# Patient Record
Sex: Female | Born: 1980 | Race: White | Hispanic: No | Marital: Single | Smoking: Current every day smoker
Health system: Southern US, Community
[De-identification: ages and names within clinical notes are randomized; demographics above are authoritative.]

## PROBLEM LIST (undated history)

## (undated) DIAGNOSIS — R112 Nausea with vomiting, unspecified: Secondary | ICD-10-CM

## (undated) DIAGNOSIS — K8020 Calculus of gallbladder without cholecystitis without obstruction: Secondary | ICD-10-CM

## (undated) DIAGNOSIS — Z9889 Other specified postprocedural states: Secondary | ICD-10-CM

## (undated) DIAGNOSIS — F988 Other specified behavioral and emotional disorders with onset usually occurring in childhood and adolescence: Secondary | ICD-10-CM

## (undated) HISTORY — PX: LEG SURGERY: SHX1003

---
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
## 2011-11-17 ENCOUNTER — Encounter: Payer: Self-pay | Admitting: *Deleted

## 2011-11-17 ENCOUNTER — Emergency Department (HOSPITAL_BASED_OUTPATIENT_CLINIC_OR_DEPARTMENT_OTHER)
Admission: EM | Admit: 2011-11-17 | Discharge: 2011-11-17 | Disposition: A | Discharge status: Home / Self Care | Payer: Self-pay | Attending: Emergency Medicine | Admitting: Emergency Medicine

## 2011-11-17 DIAGNOSIS — K279 Peptic ulcer, site unspecified, unspecified as acute or chronic, without hemorrhage or perforation: Secondary | ICD-10-CM | POA: Insufficient documentation

## 2011-11-17 DIAGNOSIS — R1013 Epigastric pain: Secondary | ICD-10-CM | POA: Insufficient documentation

## 2011-11-17 LAB — CBC
HCT: 38.9 % (ref 36.0–46.0)
Hemoglobin: 13.5 g/dL (ref 12.0–15.0)
MCH: 29.7 pg (ref 26.0–34.0)
MCHC: 34.7 g/dL (ref 30.0–36.0)
MCV: 85.7 fL (ref 78.0–100.0)
Platelets: 369 10*3/uL (ref 150–400)
RBC: 4.54 MIL/uL (ref 3.87–5.11)
RDW: 13.2 % (ref 11.5–15.5)
WBC: 12.1 10*3/uL — ABNORMAL HIGH (ref 4.0–10.5)

## 2011-11-17 LAB — URINALYSIS, ROUTINE W REFLEX MICROSCOPIC
Bilirubin Urine: NEGATIVE
Ketones, ur: NEGATIVE mg/dL
Nitrite: NEGATIVE
Urobilinogen, UA: 0.2 mg/dL (ref 0.0–1.0)

## 2011-11-17 LAB — COMPREHENSIVE METABOLIC PANEL
Alkaline Phosphatase: 76 U/L (ref 39–117)
BUN: 9 mg/dL (ref 6–23)
Chloride: 102 mEq/L (ref 96–112)
GFR calc Af Amer: >90 mL/min (ref >90)
Glucose, Bld: 170 mg/dL — ABNORMAL HIGH (ref 70–99)
Potassium: 4.1 mEq/L (ref 3.5–5.1)
Total Bilirubin: 0.2 mg/dL — ABNORMAL LOW (ref 0.3–1.2)

## 2011-11-17 LAB — DIFFERENTIAL
Eosinophils Absolute: 0.2 10*3/uL (ref 0.0–0.7)
Lymphs Abs: 3.5 10*3/uL (ref 0.7–4.0)
Monocytes Relative: 6 % (ref 3–12)
Neutrophils Relative %: 64 % (ref 43–77)

## 2011-11-17 LAB — LIPASE, BLOOD: Lipase: 40 U/L (ref 11–59)

## 2011-11-17 MED ORDER — GI COCKTAIL ~~LOC~~
30.0000 mL | Freq: Once | ORAL | Status: AC
  Administered 2011-11-17: 30 mL via ORAL
  Filled 2011-11-17: qty 30

## 2011-11-17 MED ORDER — FAMOTIDINE 20 MG PO TABS: 20.0000 mg | ORAL_TABLET | Freq: Two times a day (BID) | ORAL | Status: DC

## 2011-11-17 MED ORDER — FAMOTIDINE 20 MG PO TABS
20.0000 mg | ORAL_TABLET | Freq: Once | ORAL | Status: AC
  Administered 2011-11-17: 20 mg via ORAL
  Filled 2011-11-17: qty 1

## 2011-11-17 NOTE — ED Provider Notes (Signed)
History     CSN: [id number]  Arrival date & time 11/17/11  0241   First MD Initiated Contact with Patient 11/17/11 0256      Chief Complaint  Patient presents with  . Abdominal Pain    (Consider location/radiation/quality/duration/timing/severity/associated sxs/prior treatment) HPI Comments: 30 year old female with no significant past medical history, presents with upper abdominal pain which occurred at 10:30 this evening, has been persistent, worse with laying supine, worse with palpation, associated with nausea. She denies vomiting, fever, chills, shortness of breath, back pain, dysuria, constipation, diarrhea, rectal bleeding. She states that she has had intermittent symptoms similar to this in the past but they were fleeting. Tonight her symptoms have been persistent for upwards of 4 hours. Currently she rates her tendon 7-8/10, dull and achy.  Patient admits to using frequent NSAID therapy including Advil, Naprosyn. She denies prednisone use, aspirin use. She does drink occasional alcohol and has family members with history of pancreatitis  Patient is a 30 y.o. female presenting with abdominal pain. The history is provided by the patient and a relative.  Abdominal Pain The primary symptoms of the illness include abdominal pain.    History reviewed. No pertinent past medical history.  History reviewed. No pertinent past surgical history.  No family history on file.  History  Substance Use Topics  . Smoking status: Not on file  . Smokeless tobacco: Not on file  . Alcohol Use: No    OB History    Grav Para Term Preterm Abortions TAB SAB Ect Mult Living                  Review of Systems  Gastrointestinal: Positive for abdominal pain.  All other systems reviewed and are negative.    Allergies  Review of patient's allergies indicates no known allergies.  Home Medications   Current Outpatient Rx  Name Route Sig Dispense Refill  . LISDEXAMFETAMINE DIMESYLATE 70  MG PO CAPS Oral Take 70 mg by mouth every morning.        BP 160/85  Pulse 83  Temp(Src) 97.9 F (36.6 C) (Oral)  Resp 20  SpO2 98%  Physical Exam  Nursing note and vitals reviewed. Constitutional: She appears well-developed and well-nourished. No distress.  HENT:  Head: Normocephalic and atraumatic.  Mouth/Throat: Oropharynx is clear and moist. No oropharyngeal exudate.  Eyes: Conjunctivae and EOM are normal. Pupils are equal, round, and reactive to light. Right eye exhibits no discharge. Left eye exhibits no discharge. No scleral icterus.  Neck: Normal range of motion. Neck supple. No JVD present. No thyromegaly present.  Cardiovascular: Normal rate, regular rhythm, normal heart sounds and intact distal pulses.  Exam reveals no gallop and no friction rub.   No murmur heard. Pulmonary/Chest: Effort normal and breath sounds normal. No respiratory distress. She has no wheezes. She has no rales.  Abdominal: Soft. Bowel sounds are normal. She exhibits no distension and no mass. There is tenderness ( Epigastric and right upper quadrant tenderness with mild guarding. Non-peritoneal, no pain at McBurney's point, no suprapubic tenderness, non-peritoneal).       No CVA tenderness  Musculoskeletal: Normal range of motion. She exhibits no edema and no tenderness.  Lymphadenopathy:    She has no cervical adenopathy.  Neurological: She is alert. Coordination normal.  Skin: Skin is warm and dry. No rash noted. No erythema.  Psychiatric: She has a normal mood and affect. Her behavior is normal.    ED Course  Procedures (including critical care time)  Labs Reviewed  CBC - Abnormal; Notable for the following:    WBC 12.1 (*)    All other components within normal limits  COMPREHENSIVE METABOLIC PANEL - Abnormal; Notable for the following:    Glucose, Bld 170 (*)    Total Bilirubin 0.2 (*)    All other components within normal limits  DIFFERENTIAL  LIPASE, BLOOD  URINALYSIS, ROUTINE W  REFLEX MICROSCOPIC  PREGNANCY, URINE   No results found.   1. PUD (peptic ulcer disease)       MDM  Patient has tenderness in her upper abdomen consistent with several different possible disease process is, rule out pancreatitis, peptic ulcer disease, cholecystitis. Lab work, IV medications, GI cocktail.  Laboratory workup reveals  That patient has normal liver function tests, normal lipase, normal urinalysis, not pregnant, slight leukocytosis at 12.1. After taking the GI cocktail she improved significantly. This is consistent with a diagnosis of peptic ulcer disease or gastritis. I have given her a GI cocktail as well as Pepcid prior to discharge. I discussed the findings with her and indications for return. She will followup with her family Dr. for ongoing treatment of gastritis, peptic ulcer disease. I have discussed with her at length the foods and medications which she should not take.      Vida Roller, MD 11/17/11 [phone]

## 2011-11-17 NOTE — ED Notes (Signed)
Generalized abd pain. Feels nauseous but denies vomiting.

## 2011-12-13 ENCOUNTER — Encounter (HOSPITAL_BASED_OUTPATIENT_CLINIC_OR_DEPARTMENT_OTHER): Payer: Self-pay | Admitting: *Deleted

## 2011-12-13 ENCOUNTER — Emergency Department (INDEPENDENT_AMBULATORY_CARE_PROVIDER_SITE_OTHER): Payer: Self-pay

## 2011-12-13 ENCOUNTER — Emergency Department (HOSPITAL_BASED_OUTPATIENT_CLINIC_OR_DEPARTMENT_OTHER)
Admission: EM | Admit: 2011-12-13 | Discharge: 2011-12-13 | Disposition: A | Discharge status: Home / Self Care | Payer: Self-pay | Attending: Emergency Medicine | Admitting: Emergency Medicine

## 2011-12-13 DIAGNOSIS — S63602A Unspecified sprain of left thumb, initial encounter: Secondary | ICD-10-CM

## 2011-12-13 DIAGNOSIS — M79609 Pain in unspecified limb: Secondary | ICD-10-CM

## 2011-12-13 DIAGNOSIS — S6390XA Sprain of unspecified part of unspecified wrist and hand, initial encounter: Secondary | ICD-10-CM | POA: Insufficient documentation

## 2011-12-13 DIAGNOSIS — W19XXXA Unspecified fall, initial encounter: Secondary | ICD-10-CM

## 2011-12-13 MED ORDER — HYDROCODONE-ACETAMINOPHEN 5-325 MG PO TABS: 1.0000 | ORAL_TABLET | Freq: Four times a day (QID) | ORAL | Status: AC | PRN

## 2011-12-13 NOTE — ED Provider Notes (Signed)
History     CSN: [id number]  Arrival date & time 12/13/11  1322   First MD Initiated Contact with Patient 12/13/11 1352    HPI Pt reports tripping and falling yesterday. States she hyperflexed her left thumb and wrist. States she felt something pop. Reports since has had swelling, bruising and pain in thumb.  Patient is a 31 y.o. female presenting with hand pain. The history is provided by the patient.  Hand Pain This is a new problem. The current episode started yesterday. The problem occurs constantly. The problem has been gradually worsening. Pertinent negatives include no neck pain, numbness or weakness. The symptoms are aggravated by bending (palpation of injured finger). She has tried rest for the symptoms. The treatment provided no relief.    History reviewed. No pertinent past medical history.  Past Surgical History  Procedure Date  . Leg surgery     No family history on file.  History  Substance Use Topics  . Smoking status: Not on file  . Smokeless tobacco: Not on file  . Alcohol Use: No    OB History    Grav Para Term Preterm Abortions TAB SAB Ect Mult Living                  Review of Systems  HENT: Negative for neck pain.   Musculoskeletal:       Thumb injury, pain and swelling  Skin: Negative for wound.  Neurological: Negative for weakness and numbness.  All other systems reviewed and are negative.    Allergies  Review of patient's allergies indicates no known allergies.  Home Medications   Current Outpatient Rx  Name Route Sig Dispense Refill  . FAMOTIDINE 20 MG PO TABS Oral Take 1 tablet (20 mg total) by mouth 2 (two) times daily. 30 tablet 0  . LISDEXAMFETAMINE DIMESYLATE 70 MG PO CAPS Oral Take 70 mg by mouth every morning.        BP 147/104  Pulse 89  Temp(Src) 98.1 F (36.7 C) (Oral)  Resp 20  SpO2 97%  LMP 11/22/2011  Physical Exam  Vitals reviewed. Constitutional: She is oriented to person, place, and time. Vital signs are  normal. She appears well-developed and well-nourished. No distress.  HENT:  Head: Normocephalic and atraumatic.  Eyes: Pupils are equal, round, and reactive to light.  Neck: Neck supple.  Pulmonary/Chest: Effort normal.  Musculoskeletal:       Left thumb mildly contused. Tender to palpation but full tendon function and strength. NML cap refill. Full ROM. Moderate edema  Neurological: She is alert and oriented to person, place, and time.  Skin: Skin is warm and dry. No rash noted. No erythema. No pallor.  Psychiatric: She has a normal mood and affect. Her behavior is normal.    ED Course  Procedures  Dg Finger Thumb Left  12/13/2011  *RADIOLOGY REPORT*  Clinical Data: Fall.  Pain.  LEFT THUMB 2+V  Comparison: None.  Findings: Apparent osseous irregularity at the metacarpal phalangeal joint is favored to be secondary to an accessory ossicle on the first image.  Otherwise, no fracture or dislocation.  No definite soft tissue swelling.  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 Otherwise, no acute osseous finding.  Original Report Authenticated By: Consuello Bossier, M.D.     Diagnosis: Thumb sprain   MDM   Placed patient in Thumb spica. Advised f/u with hand ortho if no improvement.  Thomasene Lot, PA-C 12/13/11 1508  Thomasene Lot, PA-C 12/13/11 1517

## 2011-12-13 NOTE — ED Notes (Signed)
Tripped and fell inj to her left thumb last pm. Bruising and pain at this time. Has been using ice.

## 2011-12-13 NOTE — ED Provider Notes (Signed)
Medical screening examination/treatment/procedure(s) were performed by non-physician practitioner and as supervising physician I was immediately available for consultation/collaboration.   Samora Jernberg R Epifania Littrell, MD 12/13/11 1618 

## 2013-11-09 ENCOUNTER — Emergency Department (HOSPITAL_BASED_OUTPATIENT_CLINIC_OR_DEPARTMENT_OTHER)
Admission: EM | Admit: 2013-11-09 | Discharge: 2013-11-09 | Disposition: A | Discharge status: Home / Self Care | Payer: Self-pay | Attending: Emergency Medicine | Admitting: Emergency Medicine

## 2013-11-09 ENCOUNTER — Encounter (HOSPITAL_BASED_OUTPATIENT_CLINIC_OR_DEPARTMENT_OTHER): Payer: Self-pay | Admitting: Emergency Medicine

## 2013-11-09 DIAGNOSIS — F17200 Nicotine dependence, unspecified, uncomplicated: Secondary | ICD-10-CM | POA: Insufficient documentation

## 2013-11-09 DIAGNOSIS — Z9889 Other specified postprocedural states: Secondary | ICD-10-CM | POA: Insufficient documentation

## 2013-11-09 DIAGNOSIS — L03116 Cellulitis of left lower limb: Secondary | ICD-10-CM

## 2013-11-09 DIAGNOSIS — F988 Other specified behavioral and emotional disorders with onset usually occurring in childhood and adolescence: Secondary | ICD-10-CM | POA: Insufficient documentation

## 2013-11-09 DIAGNOSIS — L02419 Cutaneous abscess of limb, unspecified: Secondary | ICD-10-CM | POA: Insufficient documentation

## 2013-11-09 DIAGNOSIS — Z79899 Other long term (current) drug therapy: Secondary | ICD-10-CM | POA: Insufficient documentation

## 2013-11-09 DIAGNOSIS — Z87828 Personal history of other (healed) physical injury and trauma: Secondary | ICD-10-CM | POA: Insufficient documentation

## 2013-11-09 HISTORY — DX: Other specified behavioral and emotional disorders with onset usually occurring in childhood and adolescence: F98.8

## 2013-11-09 LAB — CBC WITH DIFFERENTIAL/PLATELET
Basophils Absolute: 0 10*3/uL (ref 0.0–0.1)
Eosinophils Absolute: 0.1 10*3/uL (ref 0.0–0.7)
Eosinophils Relative: 1 % (ref 0–5)
Lymphs Abs: 2.4 10*3/uL (ref 0.7–4.0)
MCH: 28.1 pg (ref 26.0–34.0)
MCV: 87.4 fL (ref 78.0–100.0)
Platelets: 411 10*3/uL — ABNORMAL HIGH (ref 150–400)
RDW: 13.9 % (ref 11.5–15.5)

## 2013-11-09 LAB — BASIC METABOLIC PANEL
Calcium: 9.7 mg/dL (ref 8.4–10.5)
Creatinine, Ser: 0.6 mg/dL (ref 0.50–1.10)
GFR calc non Af Amer: >90 mL/min (ref >90)
Glucose, Bld: 118 mg/dL — ABNORMAL HIGH (ref 70–99)
Sodium: 137 mEq/L (ref 135–145)

## 2013-11-09 MED ORDER — ALBUTEROL SULFATE HFA 108 (90 BASE) MCG/ACT IN AERS
2.0000 | INHALATION_SPRAY | RESPIRATORY_TRACT | Status: DC | PRN
  Administered 2013-11-09: 2 via RESPIRATORY_TRACT
  Filled 2013-11-09: qty 6.7

## 2013-11-09 MED ORDER — CEPHALEXIN 250 MG PO CAPS
500.0000 mg | ORAL_CAPSULE | Freq: Once | ORAL | Status: AC
  Administered 2013-11-09: 500 mg via ORAL
  Filled 2013-11-09: qty 2

## 2013-11-09 MED ORDER — CEPHALEXIN 500 MG PO CAPS: 500.0000 mg | ORAL_CAPSULE | Freq: Four times a day (QID) | ORAL | Status: DC

## 2013-11-09 NOTE — ED Notes (Signed)
At d/c pt c/o of feeling sob at times. No wheezing noted. resp even and unlabored. 02 sats 100% on RA. No distress. Dr. Bernette Mayers aware and asked resp to give pt an inhaler. Sarah, RT in to evaluate pt.

## 2013-11-09 NOTE — ED Notes (Signed)
Pt c/o left leg swelling with redness x 3 days
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
## 2013-11-21 ENCOUNTER — Emergency Department (HOSPITAL_BASED_OUTPATIENT_CLINIC_OR_DEPARTMENT_OTHER)
Admission: EM | Admit: 2013-11-21 | Discharge: 2013-11-21 | Discharge status: Against Medical Advice | Payer: Self-pay | Attending: Emergency Medicine | Admitting: Emergency Medicine

## 2013-11-21 ENCOUNTER — Encounter (HOSPITAL_BASED_OUTPATIENT_CLINIC_OR_DEPARTMENT_OTHER): Payer: Self-pay | Admitting: Emergency Medicine

## 2013-11-21 DIAGNOSIS — F17200 Nicotine dependence, unspecified, uncomplicated: Secondary | ICD-10-CM | POA: Insufficient documentation

## 2013-11-21 DIAGNOSIS — M7989 Other specified soft tissue disorders: Secondary | ICD-10-CM | POA: Insufficient documentation

## 2013-11-21 NOTE — ED Notes (Signed)
Pt c/o bil leg swelling seen here on 12/14 for same dx cellultiis completed ABX

## 2013-11-22 ENCOUNTER — Ambulatory Visit (HOSPITAL_BASED_OUTPATIENT_CLINIC_OR_DEPARTMENT_OTHER): Payer: Self-pay

## 2013-11-22 ENCOUNTER — Emergency Department (HOSPITAL_BASED_OUTPATIENT_CLINIC_OR_DEPARTMENT_OTHER)
Admission: EM | Admit: 2013-11-22 | Discharge: 2013-11-22 | Disposition: A | Discharge status: Home / Self Care | Payer: Self-pay | Attending: Emergency Medicine | Admitting: Emergency Medicine

## 2013-11-22 ENCOUNTER — Encounter (HOSPITAL_BASED_OUTPATIENT_CLINIC_OR_DEPARTMENT_OTHER): Payer: Self-pay | Admitting: Emergency Medicine

## 2013-11-22 ENCOUNTER — Emergency Department (HOSPITAL_BASED_OUTPATIENT_CLINIC_OR_DEPARTMENT_OTHER): Payer: Self-pay

## 2013-11-22 DIAGNOSIS — Z79899 Other long term (current) drug therapy: Secondary | ICD-10-CM | POA: Insufficient documentation

## 2013-11-22 DIAGNOSIS — L02419 Cutaneous abscess of limb, unspecified: Secondary | ICD-10-CM | POA: Insufficient documentation

## 2013-11-22 DIAGNOSIS — E669 Obesity, unspecified: Secondary | ICD-10-CM | POA: Insufficient documentation

## 2013-11-22 DIAGNOSIS — Z3202 Encounter for pregnancy test, result negative: Secondary | ICD-10-CM | POA: Insufficient documentation

## 2013-11-22 DIAGNOSIS — L03116 Cellulitis of left lower limb: Secondary | ICD-10-CM

## 2013-11-22 DIAGNOSIS — F17200 Nicotine dependence, unspecified, uncomplicated: Secondary | ICD-10-CM | POA: Insufficient documentation

## 2013-11-22 DIAGNOSIS — R630 Anorexia: Secondary | ICD-10-CM | POA: Insufficient documentation

## 2013-11-22 DIAGNOSIS — R10811 Right upper quadrant abdominal tenderness: Secondary | ICD-10-CM

## 2013-11-22 DIAGNOSIS — F988 Other specified behavioral and emotional disorders with onset usually occurring in childhood and adolescence: Secondary | ICD-10-CM | POA: Insufficient documentation

## 2013-11-22 DIAGNOSIS — R1011 Right upper quadrant pain: Secondary | ICD-10-CM | POA: Insufficient documentation

## 2013-11-22 LAB — CBC WITH DIFFERENTIAL/PLATELET
Basophils Relative: 0 % (ref 0–1)
Eosinophils Absolute: 0.1 10*3/uL (ref 0.0–0.7)
Eosinophils Relative: 1 % (ref 0–5)
HCT: 40.3 % (ref 36.0–46.0)
Hemoglobin: 13 g/dL (ref 12.0–15.0)
Lymphs Abs: 3.4 10*3/uL (ref 0.7–4.0)
MCH: 27.9 pg (ref 26.0–34.0)
MCHC: 32.3 g/dL (ref 30.0–36.0)
MCV: 86.5 fL (ref 78.0–100.0)
Monocytes Absolute: 0.7 10*3/uL (ref 0.1–1.0)
Monocytes Relative: 5 % (ref 3–12)
Neutrophils Relative %: 69 % (ref 43–77)

## 2013-11-22 LAB — COMPREHENSIVE METABOLIC PANEL
Albumin: 4.1 g/dL (ref 3.5–5.2)
BUN: 10 mg/dL (ref 6–23)
Creatinine, Ser: 0.7 mg/dL (ref 0.50–1.10)
GFR calc Af Amer: >90 mL/min (ref >90)
Glucose, Bld: 116 mg/dL — ABNORMAL HIGH (ref 70–99)
Total Protein: 8 g/dL (ref 6.0–8.3)

## 2013-11-22 LAB — URINALYSIS, ROUTINE W REFLEX MICROSCOPIC
Bilirubin Urine: NEGATIVE
Glucose, UA: NEGATIVE mg/dL
Hgb urine dipstick: NEGATIVE
Ketones, ur: NEGATIVE mg/dL
Leukocytes, UA: NEGATIVE
Nitrite: NEGATIVE
Protein, ur: NEGATIVE mg/dL
Specific Gravity, Urine: 1.021 (ref 1.005–1.030)
Urobilinogen, UA: 0.2 mg/dL (ref 0.0–1.0)
pH: 6.5 (ref 5.0–8.0)

## 2013-11-22 LAB — LIPASE, BLOOD: Lipase: 46 U/L (ref 11–59)

## 2013-11-22 LAB — PREGNANCY, URINE: Preg Test, Ur: NEGATIVE

## 2013-11-22 MED ORDER — FAMOTIDINE 20 MG PO TABS: 20.0000 mg | ORAL_TABLET | Freq: Two times a day (BID) | ORAL | Status: DC

## 2013-11-22 MED ORDER — CEPHALEXIN 500 MG PO CAPS: 500.0000 mg | ORAL_CAPSULE | Freq: Three times a day (TID) | ORAL | Status: DC

## 2013-11-22 NOTE — ED Notes (Signed)
Patient here with RUQ pain that started after eating this am, reports as persistent , no nausea, no diarrhea

## 2013-11-22 NOTE — ED Notes (Signed)
Radiology tech is assisting with scheduling return visit for an outpatient ultrasound.

## 2013-11-22 NOTE — ED Provider Notes (Signed)
CSN: [id number]     Arrival date & time 11/22/13  1329 History   First MD Initiated Contact with Patient 11/22/13 1542     Chief Complaint  Patient presents with  . Abdominal Pain   (Consider location/radiation/quality/duration/timing/severity/associated sxs/prior Treatment) Patient is a 32 y.o. female presenting with abdominal pain. The history is provided by the patient.  Abdominal Pain Pain radiates to:  RUQ Pain severity:  Moderate Onset quality:  Gradual Duration:  8 hours Timing:  Constant Progression:  Improving Chronicity:  New Context: eating   Context: not previous surgeries, not recent travel, not sick contacts and not suspicious food intake  Recent illness: cellulitis of left lower leg.   Relieved by:  Nothing Worsened by:  Eating Ineffective treatments:  Antacids and OTC medications Associated symptoms: anorexia   Associated symptoms: no chest pain, no chills, no constipation, no cough, no dysuria, no fever, no hematemesis, no hematuria, no melena, no nausea, no shortness of breath, no sore throat, no vaginal bleeding and no vomiting   Risk factors: obesity   Risk factors: no alcohol abuse, has not had multiple surgeries, no NSAID use and not pregnant    Tasha Flores is a 32 y.o. female who presents to the ED with RUQ abdominal pain that started approximately 8 hours prior to arrival to the ED. The pain started after eating. She also states that she was evaluated previously for cellulitis of the left lower leg and it got better with the antibiotics but now has started to have redness again.   Past Medical History  Diagnosis Date  . ADD (attention deficit disorder)    Past Surgical History  Procedure Laterality Date  . Leg surgery     No family history on file. History  Substance Use Topics  . Smoking status: Current Every Day Smoker -- 0.50 packs/day    Types: Cigarettes  . Smokeless tobacco: Not on file  . Alcohol Use: No   OB History   Grav Para  Term Preterm Abortions TAB SAB Ect Mult Living                 Review of Systems  Constitutional: Negative for fever and chills.  HENT: Negative.  Negative for sore throat.   Eyes: Negative for visual disturbance.  Respiratory: Negative for cough and shortness of breath.   Cardiovascular: Negative for chest pain.  Gastrointestinal: Positive for abdominal pain and anorexia. Negative for nausea, vomiting, constipation, melena and hematemesis.  Genitourinary: Negative for dysuria, urgency, hematuria, decreased urine volume and vaginal bleeding.  Musculoskeletal: Negative for back pain.       Left lower leg with swelling and redness. There is tenderness but better than on last visit.   Skin: Negative for rash.  Neurological: Negative for dizziness and headaches.  Psychiatric/Behavioral: Negative for confusion. The patient is not nervous/anxious.     Allergies  Review of patient's allergies indicates no known allergies.  Home Medications   Current Outpatient Rx  Name  Route  Sig  Dispense  Refill  . escitalopram (LEXAPRO) 20 MG tablet   Oral   Take 20 mg by mouth daily.         Marland Kitchen EXPIRED: famotidine (PEPCID) 20 MG tablet   Oral   Take 1 tablet (20 mg total) by mouth 2 (two) times daily.   30 tablet   0   . lisdexamfetamine (VYVANSE) 70 MG capsule   Oral   Take 70 mg by mouth every morning.  BP 135/87  Pulse 88  Temp(Src) 98 F (36.7 C) (Oral)  Resp 18  SpO2 98%  LMP 10/25/2013 Physical Exam  Nursing note and vitals reviewed. Constitutional: She is oriented to person, place, and time. She appears well-developed and well-nourished. No distress.  HENT:  Head: Normocephalic and atraumatic.  Eyes: Conjunctivae and EOM are normal. Pupils are equal, round, and reactive to light.  Neck: Normal range of motion. Neck supple.  Cardiovascular: Normal rate, regular rhythm and normal heart sounds.   Pulmonary/Chest: Effort normal and breath sounds normal.    Abdominal: Soft. Bowel sounds are normal. There is tenderness in the right upper quadrant.  Musculoskeletal:       Left lower leg: She exhibits tenderness, swelling and deformity.       Legs: Erythema. Pedal pulse present with adequate circulation and good touch sensation. There is deformity noted to the area secondary to a previous graft..  Neurological: She is alert and oriented to person, place, and time. No cranial nerve deficit.  Skin: Skin is warm and dry.  Psychiatric: She has a normal mood and affect. Her behavior is normal.   Results for orders placed during the hospital encounter of 11/22/13 (from the past 24 hour(s))  URINALYSIS, ROUTINE W REFLEX MICROSCOPIC     Status: None   Collection Time    11/22/13  1:45 PM      Result Value Range   Color, Urine YELLOW  YELLOW   APPearance CLEAR  CLEAR   Specific Gravity, Urine 1.021  1.005 - 1.030   pH 6.5  5.0 - 8.0   Glucose, UA NEGATIVE  NEGATIVE mg/dL   Hgb urine dipstick NEGATIVE  NEGATIVE   Bilirubin Urine NEGATIVE  NEGATIVE   Ketones, ur NEGATIVE  NEGATIVE mg/dL   Protein, ur NEGATIVE  NEGATIVE mg/dL   Urobilinogen, UA 0.2  0.0 - 1.0 mg/dL   Nitrite NEGATIVE  NEGATIVE   Leukocytes, UA NEGATIVE  NEGATIVE  PREGNANCY, URINE     Status: None   Collection Time    11/22/13  1:45 PM      Result Value Range   Preg Test, Ur NEGATIVE  NEGATIVE  CBC WITH DIFFERENTIAL     Status: Abnormal   Collection Time    11/22/13  4:20 PM      Result Value Range   WBC 13.9 (*) 4.0 - 10.5 K/uL   RBC 4.66  3.87 - 5.11 MIL/uL   Hemoglobin 13.0  12.0 - 15.0 g/dL   HCT 16.1  09.6 - 04.5 %   MCV 86.5  78.0 - 100.0 fL   MCH 27.9  26.0 - 34.0 pg   MCHC 32.3  30.0 - 36.0 g/dL   RDW 40.9  81.1 - 91.4 %   Platelets 425 (*) 150 - 400 K/uL   Neutrophils Relative % 69  43 - 77 %   Neutro Abs 9.6 (*) 1.7 - 7.7 K/uL   Lymphocytes Relative 25  12 - 46 %   Lymphs Abs 3.4  0.7 - 4.0 K/uL   Monocytes Relative 5  3 - 12 %   Monocytes Absolute 0.7  0.1  - 1.0 K/uL   Eosinophils Relative 1  0 - 5 %   Eosinophils Absolute 0.1  0.0 - 0.7 K/uL   Basophils Relative 0  0 - 1 %   Basophils Absolute 0.0  0.0 - 0.1 K/uL  COMPREHENSIVE METABOLIC PANEL     Status: Abnormal   Collection Time  11/22/13  4:20 PM      Result Value Range   Sodium 137  135 - 145 mEq/L   Potassium 4.0  3.5 - 5.1 mEq/L   Chloride 100  96 - 112 mEq/L   CO2 24  19 - 32 mEq/L   Glucose, Bld 116 (*) 70 - 99 mg/dL   BUN 10  6 - 23 mg/dL   Creatinine, Ser 1.61  0.50 - 1.10 mg/dL   Calcium 09.6  8.4 - 04.5 mg/dL   Total Protein 8.0  6.0 - 8.3 g/dL   Albumin 4.1  3.5 - 5.2 g/dL   AST 12  0 - 37 U/L   ALT 22  0 - 35 U/L   Alkaline Phosphatase 82  39 - 117 U/L   Total Bilirubin 0.4  0.3 - 1.2 mg/dL   GFR calc non Af Amer >90  >90 mL/min   GFR calc Af Amer >90  >90 mL/min  LIPASE, BLOOD     Status: None   Collection Time    11/22/13  4:20 PM      Result Value Range   Lipase 46  11 - 59 U/L    ED Course: I discussed this case with Dr. Oletta Lamas. Will schedule patient to return for ultrasound on Monday. Will give diet instructions. Will treat cellulitis of left lower leg with antibiotics.   Procedures MDM   32 y.o. female with RUQ abdominal pain x 8 hours. Pain increased with eating. Discussed with patient fat free diet and need for return for ultrasound to rule out gall stones. Patient agrees to follow up. Stable for discharge without any immediate complications at this time. She will return immediately for worsening symptoms. Will also give Rx for her cellulitis of the left lower leg.    Medication List    TAKE these medications       cephALEXin 500 MG capsule  Commonly known as:  KEFLEX  Take 1 capsule (500 mg total) by mouth 3 (three) times daily.     famotidine 20 MG tablet  Commonly known as:  PEPCID  Take 1 tablet (20 mg total) by mouth 2 (two) times daily.      ASK your doctor about these medications       escitalopram 20 MG tablet  Commonly known as:   LEXAPRO  Take 20 mg by mouth daily.     lisdexamfetamine 70 MG capsule  Commonly known as:  VYVANSE  Take 70 mg by mouth every morning.          [facility] Orlene Och, NP 11/27/13 1001

## 2013-11-24 ENCOUNTER — Inpatient Hospital Stay (HOSPITAL_BASED_OUTPATIENT_CLINIC_OR_DEPARTMENT_OTHER): Admit: 2013-11-24 | Payer: Self-pay

## 2013-11-24 ENCOUNTER — Other Ambulatory Visit (HOSPITAL_BASED_OUTPATIENT_CLINIC_OR_DEPARTMENT_OTHER): Payer: Self-pay

## 2013-11-24 ENCOUNTER — Ambulatory Visit (HOSPITAL_BASED_OUTPATIENT_CLINIC_OR_DEPARTMENT_OTHER)
Admission: RE | Admit: 2013-11-24 | Discharge: 2013-11-24 | Disposition: A | Discharge status: Home / Self Care | Payer: Self-pay | Source: Ambulatory Visit | Attending: Nurse Practitioner | Admitting: Nurse Practitioner

## 2013-11-24 ENCOUNTER — Other Ambulatory Visit (HOSPITAL_BASED_OUTPATIENT_CLINIC_OR_DEPARTMENT_OTHER): Payer: Self-pay | Admitting: Nurse Practitioner

## 2013-11-24 DIAGNOSIS — K7689 Other specified diseases of liver: Secondary | ICD-10-CM | POA: Insufficient documentation

## 2013-11-24 DIAGNOSIS — R109 Unspecified abdominal pain: Secondary | ICD-10-CM

## 2013-11-24 DIAGNOSIS — K8020 Calculus of gallbladder without cholecystitis without obstruction: Secondary | ICD-10-CM | POA: Insufficient documentation

## 2013-11-24 DIAGNOSIS — R1011 Right upper quadrant pain: Secondary | ICD-10-CM | POA: Insufficient documentation

## 2013-11-24 NOTE — ED Provider Notes (Signed)
Patient had outpatient ultrasound performed and the report was given to me. Report does show multiple gallstones in the gallbladder but no evidence of acute cholecystitis. I did contact the patient at home. She is doing well with improved symptoms. She was given followup information for [facility] surgery. She was also counseled to return to the ER for fever, increasing pain. It was explained to her that this can generally be taken care of as an outpatient, but if she has persistent or worsening pain, surgery to become more urgent and she'll come back to the ER if this occurs.  Gilda Crease, MD 11/24/13 [phone]

## 2013-12-01 NOTE — ED Provider Notes (Signed)
Medical screening examination/treatment/procedure(s) were performed by non-physician practitioner and as supervising physician I was immediately available for consultation/collaboration.  EKG Interpretation   None         Gavin PoundMichael Y. Oletta LamasGhim, MD 12/01/13 [phone]

## 2013-12-03 ENCOUNTER — Observation Stay (HOSPITAL_COMMUNITY): Payer: Self-pay | Admitting: Anesthesiology

## 2013-12-03 ENCOUNTER — Emergency Department (HOSPITAL_BASED_OUTPATIENT_CLINIC_OR_DEPARTMENT_OTHER): Payer: Self-pay

## 2013-12-03 ENCOUNTER — Observation Stay (HOSPITAL_BASED_OUTPATIENT_CLINIC_OR_DEPARTMENT_OTHER)
Admission: EM | Admit: 2013-12-03 | Discharge: 2013-12-04 | Disposition: A | Discharge status: Home / Self Care | Payer: Self-pay | Attending: General Surgery | Admitting: General Surgery

## 2013-12-03 ENCOUNTER — Observation Stay (HOSPITAL_COMMUNITY): Payer: Self-pay

## 2013-12-03 ENCOUNTER — Encounter (HOSPITAL_COMMUNITY)
Admission: EM | Disposition: A | Discharge status: Home / Self Care | Payer: Self-pay | Source: Home / Self Care | Attending: Emergency Medicine

## 2013-12-03 ENCOUNTER — Inpatient Hospital Stay: Admit: 2013-12-03 | Payer: Self-pay | Admitting: General Surgery

## 2013-12-03 ENCOUNTER — Encounter (HOSPITAL_COMMUNITY): Payer: Self-pay | Admitting: Anesthesiology

## 2013-12-03 ENCOUNTER — Encounter (HOSPITAL_BASED_OUTPATIENT_CLINIC_OR_DEPARTMENT_OTHER): Payer: Self-pay | Admitting: Emergency Medicine

## 2013-12-03 DIAGNOSIS — Z79899 Other long term (current) drug therapy: Secondary | ICD-10-CM | POA: Insufficient documentation

## 2013-12-03 DIAGNOSIS — F988 Other specified behavioral and emotional disorders with onset usually occurring in childhood and adolescence: Secondary | ICD-10-CM | POA: Insufficient documentation

## 2013-12-03 DIAGNOSIS — K8010 Calculus of gallbladder with chronic cholecystitis without obstruction: Principal | ICD-10-CM | POA: Insufficient documentation

## 2013-12-03 DIAGNOSIS — R059 Cough, unspecified: Secondary | ICD-10-CM | POA: Insufficient documentation

## 2013-12-03 DIAGNOSIS — K8000 Calculus of gallbladder with acute cholecystitis without obstruction: Secondary | ICD-10-CM | POA: Diagnosis present

## 2013-12-03 DIAGNOSIS — K824 Cholesterolosis of gallbladder: Secondary | ICD-10-CM

## 2013-12-03 DIAGNOSIS — R05 Cough: Secondary | ICD-10-CM | POA: Insufficient documentation

## 2013-12-03 DIAGNOSIS — R062 Wheezing: Secondary | ICD-10-CM | POA: Insufficient documentation

## 2013-12-03 DIAGNOSIS — K8020 Calculus of gallbladder without cholecystitis without obstruction: Secondary | ICD-10-CM

## 2013-12-03 DIAGNOSIS — Z6841 Body Mass Index (BMI) 40.0 and over, adult: Secondary | ICD-10-CM | POA: Insufficient documentation

## 2013-12-03 HISTORY — PX: CHOLECYSTECTOMY: SHX55

## 2013-12-03 HISTORY — DX: Other specified postprocedural states: Z98.890

## 2013-12-03 HISTORY — DX: Calculus of gallbladder without cholecystitis without obstruction: K80.20

## 2013-12-03 HISTORY — DX: Nausea with vomiting, unspecified: R11.2

## 2013-12-03 LAB — URINALYSIS, ROUTINE W REFLEX MICROSCOPIC
GLUCOSE, UA: NEGATIVE mg/dL
HGB URINE DIPSTICK: NEGATIVE
Ketones, ur: NEGATIVE mg/dL
Leukocytes, UA: NEGATIVE
Nitrite: NEGATIVE
PROTEIN: 30 mg/dL — AB
Specific Gravity, Urine: 1.031 — ABNORMAL HIGH (ref 1.005–1.030)
Urobilinogen, UA: 1 mg/dL (ref 0.0–1.0)
pH: 6 (ref 5.0–8.0)

## 2013-12-03 LAB — CBC
HCT: 41.2 % (ref 36.0–46.0)
Hemoglobin: 13.7 g/dL (ref 12.0–15.0)
MCH: 28.1 pg (ref 26.0–34.0)
MCHC: 33.3 g/dL (ref 30.0–36.0)
MCV: 84.4 fL (ref 78.0–100.0)
PLATELETS: 469 10*3/uL — AB (ref 150–400)
RBC: 4.88 MIL/uL (ref 3.87–5.11)
RDW: 14.4 % (ref 11.5–15.5)
WBC: 15.9 10*3/uL — AB (ref 4.0–10.5)

## 2013-12-03 LAB — URINE MICROSCOPIC-ADD ON: RBC / HPF: NONE SEEN RBC/hpf (ref <3)

## 2013-12-03 LAB — COMPREHENSIVE METABOLIC PANEL
ALBUMIN: 4.2 g/dL (ref 3.5–5.2)
ALT: 20 U/L (ref 0–35)
AST: 12 U/L (ref 0–37)
Alkaline Phosphatase: 85 U/L (ref 39–117)
BILIRUBIN TOTAL: 0.4 mg/dL (ref 0.3–1.2)
BUN: 8 mg/dL (ref 6–23)
CHLORIDE: 103 meq/L (ref 96–112)
CO2: 21 mEq/L (ref 19–32)
CREATININE: 0.7 mg/dL (ref 0.50–1.10)
Calcium: 9.3 mg/dL (ref 8.4–10.5)
GFR calc Af Amer: >90 mL/min (ref >90)
GFR calc non Af Amer: >90 mL/min (ref >90)
Glucose, Bld: 144 mg/dL — ABNORMAL HIGH (ref 70–99)
Potassium: 3.6 mEq/L — ABNORMAL LOW (ref 3.7–5.3)
SODIUM: 141 meq/L (ref 137–147)
Total Protein: 7.9 g/dL (ref 6.0–8.3)

## 2013-12-03 LAB — LIPASE, BLOOD: Lipase: 41 U/L (ref 11–59)

## 2013-12-03 LAB — PREGNANCY, URINE: PREG TEST UR: NEGATIVE

## 2013-12-03 SURGERY — LAPAROSCOPIC CHOLECYSTECTOMY WITH INTRAOPERATIVE CHOLANGIOGRAM
Anesthesia: General | Site: Abdomen

## 2013-12-03 MED ORDER — BUPIVACAINE-EPINEPHRINE 0.25% -1:200000 IJ SOLN
INTRAMUSCULAR | Status: DC | PRN
  Administered 2013-12-03: 30 mL

## 2013-12-03 MED ORDER — HYDROMORPHONE HCL PF 1 MG/ML IJ SOLN
1.0000 mg | Freq: Once | INTRAMUSCULAR | Status: AC
Start: 2013-12-03 — End: 2013-12-03
  Administered 2013-12-03: 1 mg via INTRAVENOUS
  Filled 2013-12-03: qty 1

## 2013-12-03 MED ORDER — ESCITALOPRAM OXALATE 20 MG PO TABS
20.0000 mg | ORAL_TABLET | Freq: Every day | ORAL | Status: DC
  Administered 2013-12-03 – 2013-12-04 (×2): 20 mg via ORAL
  Filled 2013-12-03 (×2): qty 1

## 2013-12-03 MED ORDER — MIDAZOLAM HCL 5 MG/5ML IJ SOLN
INTRAMUSCULAR | Status: DC | PRN
  Administered 2013-12-03 (×2): 1 mg via INTRAVENOUS

## 2013-12-03 MED ORDER — NEOSTIGMINE METHYLSULFATE 1 MG/ML IJ SOLN
INTRAMUSCULAR | Status: DC | PRN
  Administered 2013-12-03: 4 mg via INTRAVENOUS

## 2013-12-03 MED ORDER — ONDANSETRON HCL 4 MG/2ML IJ SOLN
4.0000 mg | Freq: Once | INTRAMUSCULAR | Status: AC
  Administered 2013-12-03: 4 mg via INTRAVENOUS
  Filled 2013-12-03: qty 2

## 2013-12-03 MED ORDER — HYDROMORPHONE HCL PF 2 MG/ML IJ SOLN
INTRAMUSCULAR | Status: AC
  Filled 2013-12-03: qty 1

## 2013-12-03 MED ORDER — PIPERACILLIN-TAZOBACTAM 3.375 G IVPB
3.3750 g | Freq: Three times a day (TID) | INTRAVENOUS | Status: DC
  Administered 2013-12-03: 3.375 g via INTRAVENOUS
  Filled 2013-12-03 (×2): qty 50

## 2013-12-03 MED ORDER — IOHEXOL 300 MG/ML  SOLN
INTRAMUSCULAR | Status: DC | PRN
  Administered 2013-12-03: 4 mL via INTRA_ARTERIAL

## 2013-12-03 MED ORDER — LACTATED RINGERS IV SOLN
INTRAVENOUS | Status: DC | PRN
  Administered 2013-12-03 (×2): via INTRAVENOUS

## 2013-12-03 MED ORDER — PROMETHAZINE HCL 25 MG/ML IJ SOLN: 6.2500 mg | INTRAMUSCULAR | Status: DC | PRN

## 2013-12-03 MED ORDER — MIDAZOLAM HCL 2 MG/2ML IJ SOLN
INTRAMUSCULAR | Status: AC
  Filled 2013-12-03: qty 2

## 2013-12-03 MED ORDER — ALBUTEROL SULFATE HFA 108 (90 BASE) MCG/ACT IN AERS
INHALATION_SPRAY | RESPIRATORY_TRACT | Status: AC
  Filled 2013-12-03: qty 6.7

## 2013-12-03 MED ORDER — PROPOFOL 10 MG/ML IV BOLUS
INTRAVENOUS | Status: AC
  Filled 2013-12-03: qty 20

## 2013-12-03 MED ORDER — PNEUMOCOCCAL VAC POLYVALENT 25 MCG/0.5ML IJ INJ
0.5000 mL | INJECTION | INTRAMUSCULAR | Status: AC
  Administered 2013-12-04: 10:00:00 0.5 mL via INTRAMUSCULAR
  Filled 2013-12-03 (×2): qty 0.5

## 2013-12-03 MED ORDER — 0.9 % SODIUM CHLORIDE (POUR BTL) OPTIME
TOPICAL | Status: DC | PRN
  Administered 2013-12-03: 1000 mL

## 2013-12-03 MED ORDER — HYDROMORPHONE HCL PF 1 MG/ML IJ SOLN
0.2500 mg | INTRAMUSCULAR | Status: DC | PRN
  Administered 2013-12-03 (×2): 0.5 mg via INTRAVENOUS

## 2013-12-03 MED ORDER — HYDROMORPHONE HCL PF 1 MG/ML IJ SOLN
INTRAMUSCULAR | Status: DC | PRN
  Administered 2013-12-03 (×2): 0.5 mg via INTRAVENOUS
  Administered 2013-12-03: 1 mg via INTRAVENOUS

## 2013-12-03 MED ORDER — INFLUENZA VAC SPLIT QUAD 0.5 ML IM SUSP
0.5000 mL | INTRAMUSCULAR | Status: AC
  Administered 2013-12-04: 0.5 mL via INTRAMUSCULAR
  Filled 2013-12-03 (×2): qty 0.5

## 2013-12-03 MED ORDER — SUCCINYLCHOLINE CHLORIDE 20 MG/ML IJ SOLN
INTRAMUSCULAR | Status: DC | PRN
  Administered 2013-12-03: 140 mg via INTRAVENOUS

## 2013-12-03 MED ORDER — LACTATED RINGERS IV SOLN
INTRAVENOUS | Status: DC | PRN
  Administered 2013-12-03: 1000 mL

## 2013-12-03 MED ORDER — ROCURONIUM BROMIDE 100 MG/10ML IV SOLN
INTRAVENOUS | Status: DC | PRN
  Administered 2013-12-03: 10 mg via INTRAVENOUS
  Administered 2013-12-03: 40 mg via INTRAVENOUS

## 2013-12-03 MED ORDER — ALBUTEROL SULFATE HFA 108 (90 BASE) MCG/ACT IN AERS
INHALATION_SPRAY | RESPIRATORY_TRACT | Status: DC | PRN
  Administered 2013-12-03 (×2): 2 via RESPIRATORY_TRACT

## 2013-12-03 MED ORDER — HYDROMORPHONE HCL PF 1 MG/ML IJ SOLN
1.0000 mg | INTRAMUSCULAR | Status: DC | PRN
  Administered 2013-12-03 – 2013-12-04 (×6): 1 mg via INTRAVENOUS
  Filled 2013-12-03 (×6): qty 1

## 2013-12-03 MED ORDER — SODIUM CHLORIDE 0.9 % IV BOLUS (SEPSIS)
1000.0000 mL | Freq: Once | INTRAVENOUS | Status: AC
  Administered 2013-12-03: 1000 mL via INTRAVENOUS

## 2013-12-03 MED ORDER — NAPHAZOLINE-PHENIRAMINE 0.025-0.3 % OP SOLN: 1.0000 [drp] | Freq: Four times a day (QID) | OPHTHALMIC | Status: DC | PRN

## 2013-12-03 MED ORDER — FAMOTIDINE 20 MG PO TABS
20.0000 mg | ORAL_TABLET | Freq: Two times a day (BID) | ORAL | Status: DC
  Administered 2013-12-03 – 2013-12-04 (×2): 20 mg via ORAL
  Filled 2013-12-03 (×3): qty 1

## 2013-12-03 MED ORDER — DEXAMETHASONE SODIUM PHOSPHATE 10 MG/ML IJ SOLN
INTRAMUSCULAR | Status: DC | PRN
  Administered 2013-12-03: 10 mg via INTRAVENOUS

## 2013-12-03 MED ORDER — LORATADINE 10 MG PO TABS
10.0000 mg | ORAL_TABLET | Freq: Every day | ORAL | Status: DC
  Administered 2013-12-04: 10:00:00 10 mg via ORAL
  Filled 2013-12-03: qty 1

## 2013-12-03 MED ORDER — LIDOCAINE HCL (CARDIAC) 20 MG/ML IV SOLN
INTRAVENOUS | Status: AC
  Filled 2013-12-03: qty 5

## 2013-12-03 MED ORDER — PROPOFOL 10 MG/ML IV BOLUS
INTRAVENOUS | Status: DC | PRN
  Administered 2013-12-03: 200 mg via INTRAVENOUS

## 2013-12-03 MED ORDER — KETOROLAC TROMETHAMINE 30 MG/ML IJ SOLN: 15.0000 mg | Freq: Once | INTRAMUSCULAR | Status: DC | PRN

## 2013-12-03 MED ORDER — FENTANYL CITRATE 0.05 MG/ML IJ SOLN
INTRAMUSCULAR | Status: AC
  Filled 2013-12-03: qty 5

## 2013-12-03 MED ORDER — ONDANSETRON HCL 4 MG/2ML IJ SOLN
INTRAMUSCULAR | Status: AC
  Filled 2013-12-03: qty 2

## 2013-12-03 MED ORDER — ONDANSETRON HCL 4 MG/2ML IJ SOLN: 4.0000 mg | Freq: Four times a day (QID) | INTRAMUSCULAR | Status: DC | PRN

## 2013-12-03 MED ORDER — ONDANSETRON HCL 4 MG/2ML IJ SOLN
INTRAMUSCULAR | Status: DC | PRN
  Administered 2013-12-03: 4 mg via INTRAVENOUS

## 2013-12-03 MED ORDER — HYDROMORPHONE HCL PF 1 MG/ML IJ SOLN
1.0000 mg | Freq: Once | INTRAMUSCULAR | Status: AC
  Administered 2013-12-03: 1 mg via INTRAVENOUS
  Filled 2013-12-03: qty 1

## 2013-12-03 MED ORDER — DEXAMETHASONE SODIUM PHOSPHATE 10 MG/ML IJ SOLN
INTRAMUSCULAR | Status: AC
  Filled 2013-12-03: qty 1

## 2013-12-03 MED ORDER — ROCURONIUM BROMIDE 100 MG/10ML IV SOLN
INTRAVENOUS | Status: AC
  Filled 2013-12-03: qty 1

## 2013-12-03 MED ORDER — BUPIVACAINE-EPINEPHRINE PF 0.25-1:200000 % IJ SOLN
INTRAMUSCULAR | Status: AC
  Filled 2013-12-03: qty 30

## 2013-12-03 MED ORDER — LIDOCAINE HCL (CARDIAC) 20 MG/ML IV SOLN
INTRAVENOUS | Status: DC | PRN
  Administered 2013-12-03: 80 mg via INTRAVENOUS

## 2013-12-03 MED ORDER — HYDROMORPHONE HCL PF 1 MG/ML IJ SOLN
INTRAMUSCULAR | Status: AC
  Filled 2013-12-03: qty 1

## 2013-12-03 MED ORDER — FENTANYL CITRATE 0.05 MG/ML IJ SOLN
INTRAMUSCULAR | Status: DC | PRN
  Administered 2013-12-03: 50 ug via INTRAVENOUS
  Administered 2013-12-03: 150 ug via INTRAVENOUS
  Administered 2013-12-03: 50 ug via INTRAVENOUS

## 2013-12-03 MED ORDER — GLYCOPYRROLATE 0.2 MG/ML IJ SOLN
INTRAMUSCULAR | Status: DC | PRN
  Administered 2013-12-03: 0.6 mg via INTRAVENOUS

## 2013-12-03 SURGICAL SUPPLY — 34 items
ADH SKN CLS APL DERMABOND .7 (GAUZE/BANDAGES/DRESSINGS)
APPLIER CLIP ROT 10 11.4 M/L (STAPLE) ×3
APR CLP MED LRG 11.4X10 (STAPLE) ×1
BAG SPEC RTRVL LRG 6X4 10 (ENDOMECHANICALS)
CANISTER SUCTION 2500CC (MISCELLANEOUS) ×3 IMPLANT
CATH REDDICK CHOLANGI 4FR 50CM (CATHETERS) ×3 IMPLANT
CHLORAPREP W/TINT 26ML (MISCELLANEOUS) ×3 IMPLANT
CLIP APPLIE ROT 10 11.4 M/L (STAPLE) ×1 IMPLANT
COVER MAYO STAND STRL (DRAPES) ×3 IMPLANT
DECANTER SPIKE VIAL GLASS SM (MISCELLANEOUS) ×1 IMPLANT
DERMABOND ADVANCED (GAUZE/BANDAGES/DRESSINGS)
DERMABOND ADVANCED .7 DNX12 (GAUZE/BANDAGES/DRESSINGS) ×1 IMPLANT
DRAPE C-ARM 42X120 X-RAY (DRAPES) ×3 IMPLANT
DRAPE LAPAROSCOPIC ABDOMINAL (DRAPES) ×3 IMPLANT
DRAPE UTILITY XL STRL (DRAPES) ×3 IMPLANT
ELECT REM PT RETURN 9FT ADLT (ELECTROSURGICAL) ×3
ELECTRODE REM PT RTRN 9FT ADLT (ELECTROSURGICAL) ×1 IMPLANT
GLOVE BIO SURGEON STRL SZ7.5 (GLOVE) ×6 IMPLANT
GOWN STRL REUS W/ TWL XL LVL3 (GOWN DISPOSABLE) ×1 IMPLANT
GOWN STRL REUS W/TWL LRG LVL3 (GOWN DISPOSABLE) ×3 IMPLANT
GOWN STRL REUS W/TWL XL LVL3 (GOWN DISPOSABLE) ×6 IMPLANT
HEMOSTAT SURGICEL 4X8 (HEMOSTASIS) ×1 IMPLANT
IV CATH 14GX2 1/4 (CATHETERS) ×1 IMPLANT
KIT BASIN OR (CUSTOM PROCEDURE TRAY) ×3 IMPLANT
POUCH SPECIMEN RETRIEVAL 10MM (ENDOMECHANICALS) IMPLANT
SET IRRIG TUBING LAPAROSCOPIC (IRRIGATION / IRRIGATOR) ×3 IMPLANT
SOLUTION ANTI FOG 6CC (MISCELLANEOUS) ×3 IMPLANT
SUT MNCRL AB 4-0 PS2 18 (SUTURE) ×3 IMPLANT
TOWEL OR 17X26 10 PK STRL BLUE (TOWEL DISPOSABLE) ×7 IMPLANT
TRAY LAP CHOLE (CUSTOM PROCEDURE TRAY) ×3 IMPLANT
TROCAR BLADELESS OPT 5 75 (ENDOMECHANICALS) ×6 IMPLANT
TROCAR XCEL BLUNT TIP 100MML (ENDOMECHANICALS) ×3 IMPLANT
TROCAR XCEL NON-BLD 11X100MML (ENDOMECHANICALS) ×3 IMPLANT
TUBING INSUFFLATION 10FT LAP (TUBING) ×3 IMPLANT

## 2013-12-03 NOTE — Progress Notes (Signed)
[facility] provided pt with a list of self-pay pcps, [employer] information, and a [employer] application.  °
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
## 2013-12-03 NOTE — H&P (Signed)
Seen and agree  

## 2013-12-03 NOTE — ED Notes (Signed)
Pt c/o RUQ intermittent "grabbing" pain worse after eating yesterday. Pt sts she had outpt study and it showed gallstones.

## 2013-12-03 NOTE — Preoperative (Signed)
Beta Blockers   Reason not to administer Beta Blockers:Not Applicable 

## 2013-12-03 NOTE — ED Notes (Signed)
[facility] at bedside for transport. 

## 2013-12-03 NOTE — ED Notes (Signed)
Pt reports having a piercing on her clitoris that she is unable to take out. OR nurse made aware.

## 2013-12-03 NOTE — Op Note (Signed)
12/03/2013  4:09 PM  PATIENT:  Tasha JeffersonElizabeth A Flores  33 y.o. female  PRE-OPERATIVE DIAGNOSIS:  Cholelithiasis  POST-OPERATIVE DIAGNOSIS:  Cholelithiasis  PROCEDURE:  Procedure(s): LAPAROSCOPIC CHOLECYSTECTOMY WITH INTRAOPERATIVE CHOLANGIOGRAM (N/A)  SURGEON:  Surgeon(s) and Role:    * [name], MD - Primary  PHYSICIAN ASSISTANT:   ASSISTANTS: none   ANESTHESIA:   general  EBL:  Total I/O In: 1000 [I.V.:1000] Out: -   BLOOD ADMINISTERED:none  DRAINS: none   LOCAL MEDICATIONS USED:  MARCAINE     SPECIMEN:  Source of Specimen:  gallbladder  DISPOSITION OF SPECIMEN:  PATHOLOGY  COUNTS:  YES  TOURNIQUET:  * No tourniquets in log *  DICTATION: .Dragon Dictation  Procedure: After informed consent was obtained the patient was brought to the operating room and placed in the supine position on the operating room table. After adequate induction of general anesthesia the patient's abdomen was prepped with ChloraPrep allowed to dry and draped in usual sterile manner. The area below the umbilicus was infiltrated with quarter percent  Marcaine. A small incision was made with a 15 blade knife. The incision was carried down through the subcutaneous tissue bluntly with a hemostat and [employer] retractors. The linea alba was identified. The linea alba was incised with a 15 blade knife and each side was grasped with Coker clamps. The preperitoneal space was then probed with a hemostat until the peritoneum was opened and access was gained to the abdominal cavity. A 0 Vicryl pursestring stitch was placed in the fascia surrounding the opening. A Hassan cannula was then placed through the opening and anchored in place with the previously placed Vicryl purse string stitch. The abdomen was insufflated with carbon dioxide without difficulty. A laparoscope was [facility] the [facility]ula in the right upper quadrant was inspected. Next the epigastric region was infiltrated with % Marcaine.  A small incision was made with a 15 blade knife. A 10 mm port was placed bluntly through this incision into the abdominal cavity under direct vision. Next 2 sites were chosen laterally on the right side of the abdomen for placement of 5 mm ports. Each of these areas was infiltrated with quarter percent Marcaine. Small stab incisions were made with a 15 blade knife. 5 mm ports were then placed bluntly through these incisions into the abdominal cavity under direct vision without difficulty. A blunt grasper was placed through the lateralmost 5 mm port and used to grasp the dome of the gallbladder and elevated anteriorly and superiorly. Another blunt grasper was placed through the other 5 mm port and used to retract the body and neck of the gallbladder. A dissector was placed through the epigastric port and using the electrocautery the peritoneal reflection at the gallbladder neck was opened. Blunt dissection was then carried out in this area until the gallbladder neck-cystic duct junction was readily identified and a good window was created. A single clip was placed on the gallbladder neck. A small  ductotomy was made just below the clip with laparoscopic scissors. A 14-gauge Angiocath was then placed through the anterior abdominal wall under direct vision. A Reddick cholangiogram catheter was then placed through the Angiocath and flushed. The catheter was then placed in the cystic duct and anchored in place with a clip. A cholangiogram was obtained that showed no filling defects good emptying into the duodenum an adequate length on the cystic duct. The anchoring clip and catheters were then removed from the patient. 3 clips were placed proximally on  the cystic duct and the duct was divided between the 2 sets of clips. Posterior to this the cystic artery was identified and again dissected bluntly in a circumferential manner until a good window  was created. 2 clips were placed proximally and one distally on the artery  and the artery was divided between the 2 sets of clips. Next a laparoscopic hook cautery device was used to separate the gallbladder from the liver bed. Prior to completely detaching the gallbladder from the liver bed the liver bed was inspected and several small bleeding points were coagulated with the electrocautery until the area was completely hemostatic. The gallbladder was then detached the rest of it from the liver bed without difficulty. A laparoscopic bag was inserted through the epigastric port. The gallbladder was placed within the bag and the bag was sealed. A laparoscope was then moved to the epigastric port. The gallbladder grasper was placed through the [facility] cannula and used to grasp the opening of the bag. The bag with the gallbladder was then removed with the [facility] cannula through the infraumbilical port without difficulty. The fascial defect was then closed with the previously placed Vicryl pursestring stitch as well as with another figure-of-eight 0 Vicryl stitch. The liver bed was inspected again and found to be hemostatic. The abdomen was irrigated with copious amounts of saline until the effluent was clear. The ports were then removed under direct vision without difficulty and were found to be hemostatic. The gas was allowed to escape. The skin incisions were all closed with interrupted 4-0 Monocryl subcuticular stitches. Dermabond dressings were applied. The patient tolerated the procedure well. At the end of the case all needle sponge and instrument counts were correct. The patient was then awakened and taken to recovery in stable condition  PLAN OF CARE: Admit for overnight observation  PATIENT DISPOSITION:  PACU - hemodynamically stable.   Delay start of Pharmacological VTE agent (>24hrs) due to surgical blood loss or risk of bleeding: yes

## 2013-12-03 NOTE — ED Notes (Signed)
Pt received in rm 7, sts pain is 4/10. Pt reports having diarrhea for 3 days and last night had sudden onset of upper abdominal pain. Pt was evaluated at [facility] and was sent here for surgery consult.

## 2013-12-03 NOTE — Transfer of Care (Signed)
Immediate Anesthesia Transfer of Care Note  Patient: Tasha Flores  Procedure(s) Performed: Procedure(s): LAPAROSCOPIC CHOLECYSTECTOMY WITH INTRAOPERATIVE CHOLANGIOGRAM (N/A)  Patient Location: PACU  Anesthesia Type:General  Level of Consciousness: awake, alert  and oriented  Airway & Oxygen Therapy: Patient Spontanous Breathing and Patient connected to face mask oxygen  Post-op Assessment: Report given to PACU RN and Post -op Vital signs reviewed and stable  Post vital signs: Reviewed and stable  Complications: No apparent anesthesia complications

## 2013-12-03 NOTE — Anesthesia Preprocedure Evaluation (Addendum)
Anesthesia Evaluation  Patient identified by MRN, date of birth, ID band Patient awake    Reviewed: Allergy & Precautions, H&P , NPO status , Patient's Chart, lab work & pertinent test results  Airway Mallampati: III TM Distance: <3 FB Neck ROM: Full    Dental no notable dental hx.    Pulmonary Current Smoker,  breath sounds clear to auscultation  Pulmonary exam normal       Cardiovascular negative cardio ROS  Rhythm:Regular Rate:Normal     Neuro/Psych negative neurological ROS  negative psych ROS   GI/Hepatic negative GI ROS, Neg liver ROS,   Endo/Other  Morbid obesity  Renal/GU negative Renal ROS  negative genitourinary   Musculoskeletal negative musculoskeletal ROS (+)   Abdominal (+) + obese,   Peds negative pediatric ROS (+)  Hematology negative hematology ROS (+)   Anesthesia Other Findings   Reproductive/Obstetrics negative OB ROS                          Anesthesia Physical Anesthesia Plan  ASA: II  Anesthesia Plan: General   Post-op Pain Management:    Induction: Intravenous  Airway Management Planned: Oral ETT  Additional Equipment:   Intra-op Plan:   Post-operative Plan: Extubation in OR  Informed Consent: I have reviewed the patients History and Physical, chart, labs and discussed the procedure including the risks, benefits and alternatives for the proposed anesthesia with the patient or authorized representative who has indicated his/her understanding and acceptance.   Dental advisory given  Plan Discussed with: CRNA and Surgeon  Anesthesia Plan Comments:         Anesthesia Quick Evaluation

## 2013-12-03 NOTE — ED Provider Notes (Signed)
CSN: [id number]     Arrival date & time 12/03/13  0751 History   First MD Initiated Contact with Patient 12/03/13 [phone]     Chief Complaint  Patient presents with  . Abdominal Pain   (Consider location/radiation/quality/duration/timing/severity/associated sxs/prior Treatment) HPI Pt with hx of recently diagnosed gallstones presents with right upper abdominal pain which has been ongoing since 3am.  Small amount of vomiting, nonbilious, nonbloody.  No fever/chills. Symptoms began after eating chicken nuggets and french fries.  She has not yet scheduled surgery appointment.  Also has had yellow watery stools.  Pain is constant, waxing and waning and described as sharp. There are no other associated systemic symptoms, there are no other alleviating or modifying factors.   Past Medical History  Diagnosis Date  . ADD (attention deficit disorder)   . Gallstones   . PONV (postoperative nausea and vomiting)    Past Surgical History  Procedure Laterality Date  . Leg surgery     History reviewed. No pertinent family history. History  Substance Use Topics  . Smoking status: Current Every Day Smoker -- 0.50 packs/day    Types: Cigarettes  . Smokeless tobacco: Never Used  . Alcohol Use: No   OB History   Grav Para Term Preterm Abortions TAB SAB Ect Mult Living                 Review of Systems ROS reviewed and all otherwise negative except for mentioned in HPI  Allergies  Review of patient's allergies indicates no known allergies.  Home Medications   No current outpatient prescriptions on file. BP 109/80  Pulse 66  Temp(Src) 98.5 F (36.9 C) (Oral)  Resp 16  Ht 5\' 6"  (1.676 m)  Wt 260 lb (117.935 kg)  BMI 41.99 kg/m2  SpO2 96%  LMP 10/25/2013 Vitals reviewed Physical Exam Physical Examination: General appearance - alert, uncomfortable appearing, and in no distress Mental status - alert, oriented to person, place, and time Eyes - no scleral icterus, no conjunctival  injection Mouth - mucous membranes moist, pharynx normal without lesions Chest - clear to auscultation, no wheezes, rales or rhonchi, symmetric air entry Heart - normal rate, regular rhythm, normal S1, S2, no murmurs, rubs, clicks or gallops  Abdomen - soft, ttp in RUQ and epigastric tenderness, nondistended, no masses or organomegaly, nabs Extremities - peripheral pulses normal, no pedal edema, no clubbing or cyanosis Skin - normal coloration and turgor, no rashes  ED Course  Procedures (including critical care time)  11:08 AM d/w PA Fayrene FearingJames for Dr. Carolynne Edouardoth, patient [facility] surgery evaluation.  Her pain is improved ofter dilaudid  11:23 AM pt and mother updated about plan for transer.  All questions answered to the best of my ability.  Labs Review Labs Reviewed  URINALYSIS, ROUTINE W REFLEX MICROSCOPIC - Abnormal; Notable for the following:    Color, Urine AMBER (*)    APPearance CLOUDY (*)    Specific Gravity, Urine 1.031 (*)    Bilirubin Urine SMALL (*)    Protein, ur 30 (*)    All other components within normal limits  CBC - Abnormal; Notable for the following:    WBC 15.9 (*)    Platelets 469 (*)    All other components within normal limits  COMPREHENSIVE METABOLIC PANEL - Abnormal; Notable for the following:    Potassium 3.6 (*)    Glucose, Bld 144 (*)    All other components within normal limits  URINE MICROSCOPIC-ADD ON - Abnormal; Notable for the following:    Bacteria, UA MANY (*)    Casts HYALINE CASTS (*)    All other components within normal limits  PREGNANCY, URINE  LIPASE, BLOOD   Imaging Review US Abdomen Complete  12/03/2013   CLINICAL DATA:  Right upper quadrant abdominal pain. Nausea and vomiting.  EXAM: ULTRASOUND ABDOMEN COMPLETE  COMPARISON:  Abdominal ultrasound 11/24/2013.  FINDINGS: Gallbladder:  Multiple dependent echogenic foci with posterior acoustic shadowing are noted within the lumen of the gallbladder, compatible  with gallstones. However, the gallbladder does not appear distended. Gallbladder wall thickness is normal at 2 mm. No abnormal pericholecystic fluid. Per report from the sonographer, the patient did have a positive sonographic Murphy's sign on examination.  Common bile duct:  Diameter: 2.3 mm in the porta hepatis. Distal common bile duct could not be visualized.  Liver:  No focal lesion identified. Diffusely echogenic hepatic parenchyma, suggestive of hepatic steatosis.  IVC:  Poorly visualized secondary to overlying bowel gas.  Pancreas:  Poorly visualized secondary to overlying bowel gas.  Spleen:  10.6 cm in length.  Unremarkable.  Right Kidney:  Length: 11.5 cm. Echogenicity within normal limits. No mass or hydronephrosis visualized.  Left Kidney:  Length: 11.8 cm. Echogenicity within normal limits. No mass or hydronephrosis visualized.  Abdominal aorta:  Poorly visualized, but measures up to 2.4 cm in diameter proximally, and appears to taper distally.  Other findings:  None.  IMPRESSION: 1. Study is positive for cholelithiasis. This patient did demonstrate a positive sonographic Murphy's sign per report from the sonographer, however, the gallbladder does not appear distended at this time, and there is no gallbladder wall thickening or pericholecystic fluid. Overall, findings are equivocal for evidence of acute cholecystitis, and clinical correlation is recommended. 2. Echogenic hepatic parenchyma suggestive of hepatic steatosis.   Electronically Signed   By: Trudie Reed M.D.   On: 12/03/2013 09:50    EKG Interpretation    Date/Time:    Ventricular Rate:    PR Interval:    QRS Duration:   QT Interval:    QTC Calculation:   R Axis:     Text Interpretation:              MDM   1. Symptomatic cholelithiasis    Pt presenting with epigastric and right upper abdominal pain, she has known gallstones.  Labs reassuring with the exception of elevated WBC, ultrasound shows no gallbladder wall  abnormalities, or increased diameter of CBD.  Given increased WBC d/w surgery who recommended transfer [facility] ED for surgical evaluation.  Pt and mother are both agreeable with this plan.  Pt remains NPO in the ED.      Ethelda Chick, MD 12/03/13 [phone]

## 2013-12-03 NOTE — ED Notes (Signed)
Bed: WA07 Expected date:  Expected time:  Means of arrival:  Comments: [facility]-gallstones

## 2013-12-04 ENCOUNTER — Encounter (HOSPITAL_COMMUNITY): Payer: Self-pay | Admitting: General Surgery

## 2013-12-04 MED ORDER — OXYCODONE-ACETAMINOPHEN 5-325 MG PO TABS: 1.0000 | ORAL_TABLET | ORAL | Status: DC | PRN

## 2013-12-04 NOTE — Progress Notes (Signed)
Pt discharged home. Minimal pain. No nausea. D/c summary discussed and prescriptions given and explained to pt.

## 2013-12-04 NOTE — Discharge Summary (Signed)
Physician Discharge Summary  Patient ID: Tasha Flores MRN: [id number] DOB/AGE: 32/07/1981 31 y.o.  Admit date: 12/03/2013 Discharge date: 12/04/2013  Admission Diagnoses:  Discharge Diagnoses:  Active Problems:   Cholelithiasis and acute cholecystitis without obstruction   Discharged Condition: good  Hospital Course: uneventful postop recovery  Consults: None  Significant Diagnostic Studies:   Treatments: surgery: lap chole with IOC  Discharge Exam: Blood pressure 123/68, pulse 65, temperature 97.4 F (36.3 C), temperature source Oral, resp. rate 16, height 5\' 6"  (1.676 m), weight 260 lb (117.935 kg), last menstrual period 10/25/2013, SpO2 97.00%. General appearance: alert, cooperative and no distress Incision/Wound: abdomen soft, incisions clean  Disposition: 01-Home or Self Care     Medication List         cephALEXin 500 MG capsule  Commonly known as:  KEFLEX  Take 1 capsule (500 mg total) by mouth 3 (three) times daily.     cetirizine 10 MG tablet  Commonly known as:  ZYRTEC  Take 10 mg by mouth daily as needed for allergies.     escitalopram 20 MG tablet  Commonly known as:  LEXAPRO  Take 20 mg by mouth daily.     famotidine 20 MG tablet  Commonly known as:  PEPCID  Take 1 tablet (20 mg total) by mouth 2 (two) times daily.     naphazoline-pheniramine 0.025-0.3 % ophthalmic solution  Commonly known as:  NAPHCON-A  Place 1 drop into both eyes 4 (four) times daily as needed for irritation.     oxyCODONE-acetaminophen 5-325 MG per tablet  Commonly known as:  ROXICET  Take 1-2 tablets by mouth every 4 (four) hours as needed for severe pain.           Follow-up Information   Follow up with [name],PAUL S, MD. Call in 2 weeks.   Specialty:  General Surgery   Contact information:   [address] [place] [address] [phone]       Signed: Shelly RubensteinBLACKMAN,Avyan Livesay A 12/04/2013, 11:28 AM

## 2013-12-04 NOTE — Progress Notes (Signed)
Doing well Wants to go home Abdomen benign  Will discharge

## 2013-12-04 NOTE — Discharge Instructions (Signed)
[facility] ______[facility] LAPAROSCOPIC SURGERY: POST OP INSTRUCTIONS Always review your discharge instruction sheet given to you by the facility where your surgery was performed. IF YOU HAVE DISABILITY OR FAMILY LEAVE FORMS, YOU MUST BRING THEM TO THE OFFICE FOR PROCESSING.   DO NOT GIVE THEM TO YOUR DOCTOR.  1. A prescription for pain medication may be given to you upon discharge.  Take your pain medication as prescribed, if needed.  If narcotic pain medicine is not needed, then you may take acetaminophen (Tylenol) or ibuprofen (Advil) as needed. 2. Take your usually prescribed medications unless otherwise directed. 3. If you need a refill on your pain medication, please contact your pharmacy.  They will contact our office to request authorization. Prescriptions will not be filled after 5pm or on week-ends. 4. You should follow a light diet the first few days after arrival home, such as soup and crackers, etc.  Be sure to include lots of fluids daily. 5. Most patients will experience some swelling and bruising in the area of the incisions.  Ice packs will help.  Swelling and bruising can take several days to resolve.  6. It is common to experience some constipation if taking pain medication after surgery.  Increasing fluid intake and taking a stool softener (such as Colace) will usually help or prevent this problem from occurring.  A mild laxative (Milk of Magnesia or Miralax) should be taken according to package instructions if there are no bowel movements after 48 hours. 7. Unless discharge instructions indicate otherwise, you may remove your bandages 24-48 hours after surgery, and you may shower at that time.  You may have steri-strips (small skin tapes) in place directly over the incision.  These strips should be left on the skin for 7-10 days.  If your surgeon used skin glue on the incision, you may shower in 24 hours.  The glue will flake off over the next 2-3 weeks.  Any sutures or  staples will be removed at the office during your follow-up visit. 8. ACTIVITIES:  You may resume regular (light) daily activities beginning the next day--such as daily self-care, walking, climbing stairs--gradually increasing activities as tolerated.  You may have sexual intercourse when it is comfortable.  Refrain from any heavy lifting or straining until approved by your doctor. a. You may drive when you are no longer taking prescription pain medication, you can comfortably wear a seatbelt, and you can safely maneuver your car and apply brakes. b. RETURN TO WORK:  __________________________________________________________ 9. You should see your doctor in the office for a follow-up appointment approximately 2-3 weeks after your surgery.  Make sure that you call for this appointment within a day or two after you arrive home to insure a convenient appointment time. 10. OTHER INSTRUCTIONS: __IBUPROFEN AND ICE PACK ALSO FOR PAIN 11. NO LIFTING MORE THAN 15 POUNDS FOR 2 WEEKS 12. STOOL SOFTENER FOR CONSTIPATION________________________________________________________________________________________________________________________ __________________________________________________________________________________________________________________________ WHEN TO CALL YOUR DOCTOR: 1. Fever over 101.0 2. Inability to urinate 3. Continued bleeding from incision. 4. Increased pain, redness, or drainage from the incision. 5. Increasing abdominal pain  The clinic staff is available to answer your questions during regular business hours.  Please dont hesitate to call and ask to speak to one of the nurses for clinical concerns.  If you have a medical emergency, go to the nearest emergency room or call 911.  A surgeon from [facility] is always on call at the hospital. [address], [address], [place], [place]  [id number] ? [address], [place], [place]   [phone] ? [phone] ?  FAX [phone] Web site: [email or web address]

## 2013-12-15 NOTE — Anesthesia Postprocedure Evaluation (Signed)
  Anesthesia Post-op Note  Patient: Tasha JeffersonElizabeth A Rollinson  Procedure(s) Performed: Procedure(s) (LRB): LAPAROSCOPIC CHOLECYSTECTOMY WITH INTRAOPERATIVE CHOLANGIOGRAM (N/A)  Patient Location: PACU  Anesthesia Type: General  Level of Consciousness: awake and alert   Airway and Oxygen Therapy: Patient Spontanous Breathing  Post-op Pain: mild  Post-op Assessment: Post-op Vital signs reviewed, Patient's Cardiovascular Status Stable, Respiratory Function Stable, Patent Airway and No signs of Nausea or vomiting  Last Vitals:  Filed Vitals:   12/04/13 0536  BP: 123/68  Pulse: 65  Temp: 36.3 C  Resp: 16    Post-op Vital Signs: stable   Complications: No apparent anesthesia complications

## 2013-12-18 ENCOUNTER — Encounter (HOSPITAL_COMMUNITY): Payer: Self-pay | Admitting: Emergency Medicine

## 2013-12-18 ENCOUNTER — Inpatient Hospital Stay (HOSPITAL_COMMUNITY)
Admission: EM | Admit: 2013-12-18 | Discharge: 2013-12-22 | DRG: 603 | Disposition: A | Discharge status: Home / Self Care | Payer: Self-pay | Attending: Family Medicine | Admitting: Family Medicine

## 2013-12-18 DIAGNOSIS — E8881 Metabolic syndrome: Secondary | ICD-10-CM | POA: Diagnosis present

## 2013-12-18 DIAGNOSIS — E119 Type 2 diabetes mellitus without complications: Secondary | ICD-10-CM | POA: Diagnosis present

## 2013-12-18 DIAGNOSIS — E785 Hyperlipidemia, unspecified: Secondary | ICD-10-CM | POA: Diagnosis present

## 2013-12-18 DIAGNOSIS — L02419 Cutaneous abscess of limb, unspecified: Principal | ICD-10-CM | POA: Diagnosis present

## 2013-12-18 DIAGNOSIS — L03119 Cellulitis of unspecified part of limb: Principal | ICD-10-CM

## 2013-12-18 DIAGNOSIS — E669 Obesity, unspecified: Secondary | ICD-10-CM | POA: Diagnosis present

## 2013-12-18 DIAGNOSIS — Z720 Tobacco use: Secondary | ICD-10-CM | POA: Diagnosis present

## 2013-12-18 DIAGNOSIS — L0291 Cutaneous abscess, unspecified: Secondary | ICD-10-CM

## 2013-12-18 DIAGNOSIS — L0390 Cellulitis, unspecified: Secondary | ICD-10-CM | POA: Diagnosis present

## 2013-12-18 DIAGNOSIS — F17200 Nicotine dependence, unspecified, uncomplicated: Secondary | ICD-10-CM | POA: Diagnosis present

## 2013-12-18 DIAGNOSIS — Z6841 Body Mass Index (BMI) 40.0 and over, adult: Secondary | ICD-10-CM

## 2013-12-18 DIAGNOSIS — F988 Other specified behavioral and emotional disorders with onset usually occurring in childhood and adolescence: Secondary | ICD-10-CM | POA: Diagnosis present

## 2013-12-18 LAB — URINALYSIS, ROUTINE W REFLEX MICROSCOPIC
Bilirubin Urine: NEGATIVE
Glucose, UA: NEGATIVE mg/dL
HGB URINE DIPSTICK: NEGATIVE
Ketones, ur: NEGATIVE mg/dL
LEUKOCYTES UA: NEGATIVE
Nitrite: NEGATIVE
PROTEIN: 30 mg/dL — AB
Specific Gravity, Urine: 1.019 (ref 1.005–1.030)
UROBILINOGEN UA: 1 mg/dL (ref 0.0–1.0)
pH: 8.5 — ABNORMAL HIGH (ref 5.0–8.0)

## 2013-12-18 LAB — COMPREHENSIVE METABOLIC PANEL
ALBUMIN: 3.6 g/dL (ref 3.5–5.2)
ALT: 51 U/L — ABNORMAL HIGH (ref 0–35)
AST: 21 U/L (ref 0–37)
Alkaline Phosphatase: 105 U/L (ref 39–117)
BILIRUBIN TOTAL: 0.7 mg/dL (ref 0.3–1.2)
BUN: 5 mg/dL — ABNORMAL LOW (ref 6–23)
CO2: 27 meq/L (ref 19–32)
CREATININE: 0.62 mg/dL (ref 0.50–1.10)
Calcium: 9.5 mg/dL (ref 8.4–10.5)
Chloride: 101 mEq/L (ref 96–112)
GFR calc Af Amer: >90 mL/min (ref >90)
Glucose, Bld: 162 mg/dL — ABNORMAL HIGH (ref 70–99)
Potassium: 4.1 mEq/L (ref 3.7–5.3)
Sodium: 140 mEq/L (ref 137–147)
Total Protein: 7.7 g/dL (ref 6.0–8.3)

## 2013-12-18 LAB — CBC WITH DIFFERENTIAL/PLATELET
BASOS ABS: 0 10*3/uL (ref 0.0–0.1)
BASOS PCT: 0 % (ref 0–1)
Eosinophils Absolute: 0.1 10*3/uL (ref 0.0–0.7)
Eosinophils Relative: 1 % (ref 0–5)
HCT: 38 % (ref 36.0–46.0)
HEMOGLOBIN: 12.1 g/dL (ref 12.0–15.0)
Lymphocytes Relative: 23 % (ref 12–46)
Lymphs Abs: 2.2 10*3/uL (ref 0.7–4.0)
MCH: 27.5 pg (ref 26.0–34.0)
MCHC: 31.8 g/dL (ref 30.0–36.0)
MCV: 86.4 fL (ref 78.0–100.0)
MONO ABS: 0.5 10*3/uL (ref 0.1–1.0)
Monocytes Relative: 5 % (ref 3–12)
Neutro Abs: 6.8 10*3/uL (ref 1.7–7.7)
Neutrophils Relative %: 71 % (ref 43–77)
Platelets: 440 10*3/uL — ABNORMAL HIGH (ref 150–400)
RBC: 4.4 MIL/uL (ref 3.87–5.11)
RDW: 14.9 % (ref 11.5–15.5)
WBC: 9.5 10*3/uL (ref 4.0–10.5)

## 2013-12-18 LAB — CG4 I-STAT (LACTIC ACID): Lactic Acid, Venous: 0.83 mmol/L (ref 0.5–2.2)

## 2013-12-18 LAB — URINE MICROSCOPIC-ADD ON

## 2013-12-18 LAB — PREGNANCY, URINE: Preg Test, Ur: NEGATIVE

## 2013-12-18 MED ORDER — VANCOMYCIN HCL IN DEXTROSE 1-5 GM/200ML-% IV SOLN
1000.0000 mg | Freq: Two times a day (BID) | INTRAVENOUS | Status: DC
  Administered 2013-12-19 – 2013-12-20 (×4): 1000 mg via INTRAVENOUS
  Filled 2013-12-18 (×6): qty 200

## 2013-12-18 MED ORDER — VANCOMYCIN HCL 10 G IV SOLR
2000.0000 mg | INTRAVENOUS | Status: AC
  Administered 2013-12-18: 2000 mg via INTRAVENOUS
  Filled 2013-12-18: qty 2000

## 2013-12-18 MED ORDER — ENOXAPARIN SODIUM 40 MG/0.4ML ~~LOC~~ SOLN
40.0000 mg | SUBCUTANEOUS | Status: DC
  Administered 2013-12-19 – 2013-12-21 (×4): 40 mg via SUBCUTANEOUS
  Filled 2013-12-18 (×6): qty 0.4

## 2013-12-18 MED ORDER — OXYCODONE-ACETAMINOPHEN 5-325 MG PO TABS
1.0000 | ORAL_TABLET | ORAL | Status: DC | PRN
  Administered 2013-12-19 – 2013-12-21 (×13): 1 via ORAL
  Filled 2013-12-18 (×13): qty 1

## 2013-12-18 NOTE — ED Notes (Addendum)
Pt reports having bilateral leg swelling and pain that began two days ago. Pt reports being seen for a similar problem in December, which she was diagnosed with cellulitis and was started on antibiotics. Pt reports that the antibiotics helped, however her symptoms did not fully resolve.
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
## 2013-12-18 NOTE — Progress Notes (Signed)
ANTIBIOTIC CONSULT NOTE - INITIAL  Pharmacy Consult for vancomycin Indication: cellulitis  No Known Allergies  Patient Measurements:   12/03/13: Wt 118kg Ht 66 in IBW 64 kg  Vital Signs: Temp: 99 F (37.2 C) (01/22 1741) Temp src: Oral (01/22 1741) BP: 144/106 mmHg (01/22 1741) Pulse Rate: 91 (01/22 1741) Intake/Output from previous day:   Intake/Output from this shift:    Labs:  Recent Labs  12/18/13 1815  WBC 9.5  HGB 12.1  PLT 440*  CREATININE 0.62   The CrCl is unknown because both a height and weight (above a minimum accepted value) are required for this calculation. No results found for this basename: VANCOTROUGH, VANCOPEAK, VANCORANDOM, GENTTROUGH, GENTPEAK, GENTRANDOM, TOBRATROUGH, TOBRAPEAK, TOBRARND, AMIKACINPEAK, AMIKACINTROU, AMIKACIN,  in the last 72 hours   Microbiology: No results found for this or any previous visit (from the past 720 hour(s)).  Medical History: Past Medical History  Diagnosis Date  . ADD (attention deficit disorder)   . Gallstones   . PONV (postoperative nausea and vomiting)     Medications:  Scheduled:  . enoxaparin (LOVENOX) injection  40 mg Subcutaneous Q24H   Infusions:    Assessment: 33 yo female presented to ER with bilateral leg swelling and pain and reportedly had similar problem in December that was treated with antibiotics but the cellulitis never fully resolved. To start vancomycin per pharmacy dosing in the meantime. Stable labs and vitals. Estimated normalized CrCl > 100 ml/min/1.8373m2  Goal of Therapy:  Vancomycin trough level 10-15 mcg/ml - will use this goal unless found to have osteo or deeper infection and at that time will change goal to 15-20  Plan:  1) Vancomycin 2g IV load then 2) vancomycin 1g IV q12 thereafter   Hessie KnowsJustin M Wilena Tyndall, PharmD, BCPS Pager [phone] 12/18/2013 9:52 PM

## 2013-12-18 NOTE — Progress Notes (Signed)
   CARE MANAGEMENT ED NOTE 12/18/2013  Patient:  Tasha Flores,Tasha Flores   Account Number:  [id number]  Date Initiated:  12/18/2013  Documentation initiated by:  Radford PaxFERRERO,Brit Wernette  Subjective/Objective Assessment:   Patient presents to ED with bilateral lower extremity redness and swelling     Subjective/Objective Assessment Detail:   Patient with history of cellulitis     Action/Plan:   Action/Plan Detail:   Anticipated DC Date:       Status Recommendation to Physician:   Result of Recommendation:    Other ED Services  Consult Working Plan    DC Planning Services  Other  PCP issues    Choice offered to / List presented to:            Status of service:  Completed, signed off  ED Comments:   ED Comments Detail:  Patient reports she has just seen Flores physician today by the name of Dr. Eunice Blaseebbie [name] [facility] and intends on kepping her as her pcp.  System updated

## 2013-12-19 DIAGNOSIS — R0989 Other specified symptoms and signs involving the circulatory and respiratory systems: Secondary | ICD-10-CM

## 2013-12-19 LAB — CBC
HEMATOCRIT: 34.9 % — AB (ref 36.0–46.0)
Hemoglobin: 11.1 g/dL — ABNORMAL LOW (ref 12.0–15.0)
MCH: 27.6 pg (ref 26.0–34.0)
MCHC: 31.8 g/dL (ref 30.0–36.0)
MCV: 86.8 fL (ref 78.0–100.0)
Platelets: 347 10*3/uL (ref 150–400)
RBC: 4.02 MIL/uL (ref 3.87–5.11)
RDW: 15 % (ref 11.5–15.5)
WBC: 7.8 10*3/uL (ref 4.0–10.5)

## 2013-12-19 LAB — HEMOGLOBIN A1C
Hgb A1c MFr Bld: 6.6 % — ABNORMAL HIGH (ref <5.7)
MEAN PLASMA GLUCOSE: 143 mg/dL — AB (ref <117)

## 2013-12-19 LAB — BASIC METABOLIC PANEL
BUN: 6 mg/dL (ref 6–23)
CALCIUM: 8.8 mg/dL (ref 8.4–10.5)
CO2: 21 mEq/L (ref 19–32)
Chloride: 105 mEq/L (ref 96–112)
Creatinine, Ser: 0.58 mg/dL (ref 0.50–1.10)
GFR calc Af Amer: >90 mL/min (ref >90)
Glucose, Bld: 118 mg/dL — ABNORMAL HIGH (ref 70–99)
POTASSIUM: 3.9 meq/L (ref 3.7–5.3)
SODIUM: 141 meq/L (ref 137–147)
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
## 2013-12-19 NOTE — Progress Notes (Signed)
Brief Nutrition Assessment Note  Patient identified at risk by the Malnutrition Screening Tool.  Wt Readings from Last 10 Encounters:  12/18/13 270 lb (122.471 kg)  12/03/13 260 lb (117.935 kg)  12/03/13 260 lb (117.935 kg)  11/09/13 250 lb (113.399 kg)   Body mass index is 43.6 kg/(m^2).  Patient is currently on a regular diet. No POs have been recorded at this time, however patient reported eating 75% of breakfast.  Patient reported that she recently lost around 10 pounds 2 weeks ago when she had gall bladder surgery. She was readmitted to the hospital on 1/22 with cellulitis/bilateral leg swelling. Patient stated that her appetite has returned and she has been eating well. Patient meets criteria for obesity class III based on current BMI. Currently, weight is trending up. Patient stated that she is trying to follow a better diet in hopes of losing weight. Patient did not have any physical evidence of muscle mass or body fat depletion.   Labs and medications have been reviewed. No nutrition interventions necessary at this time. Please re-consult if any further nutrition issues arise.   Tasha Flores, Dietetic Intern Pager: [phone]  Intern note/chart reviewed. Revisions made.  Tasha Flores RD, LDN, CNSC [phone] Pager [phone] After Hours Pager

## 2013-12-19 NOTE — Progress Notes (Signed)
Utilization review completed.  

## 2013-12-19 NOTE — Progress Notes (Signed)
VASCULAR LAB PRELIMINARY  PRELIMINARY  PRELIMINARY  PRELIMINARY  Bilateral Lower extremity venous duplex completed.    Preliminary report:  Negative for deep and superficial vein thrombosis bilaterally.  Leatrice Parilla, RVT 12/19/2013, 2:37 PM
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
## 2013-12-20 MED ORDER — METFORMIN HCL 500 MG PO TABS
500.0000 mg | ORAL_TABLET | Freq: Two times a day (BID) | ORAL | Status: DC
  Administered 2013-12-20 – 2013-12-21 (×2): 500 mg via ORAL
  Filled 2013-12-20 (×4): qty 1

## 2013-12-20 NOTE — Plan of Care (Signed)
Problem: Food- and Nutrition-Related Knowledge Deficit (NB-1.1) Goal: Nutrition education Formal process to instruct or train a patient/client in a skill or to impart knowledge to help patients/clients voluntarily manage or modify food choices and eating behavior to maintain or improve health. Outcome: Completed/Met Date Met:  12/20/13  RD consulted for nutrition education regarding diabetes.     Lab Results  Component Value Date    HGBA1C 6.6* 12/18/2013    RD provided "Carbohydrate Counting for People with Diabetes" handout from the Academy of [employer] and Dietetics. Discussed different food groups and their effects on blood sugar, emphasizing carbohydrate-containing foods. Provided list of carbohydrates and recommended serving sizes of common foods.  Discussed importance of controlled and consistent carbohydrate intake throughout the day. Provided examples of ways to balance meals/snacks and encouraged intake of high-fiber, whole grain complex carbohydrates. Teach back method used.  Also discussed weight loss tips with patient. She seemed sleepy. Recommend OP diet education for reinforcement.  Expect fair compliance.  Body mass index is 43.6 kg/(m^2). Pt meets criteria for class 3, extreme/morbid obesity based on current BMI.  Current diet order is regular, patient is consuming approximately >75% of meals at this time. Labs and medications reviewed. No further nutrition interventions warranted at this time. RD contact information provided. If additional nutrition issues arise, please re-consult RD.  Molli Barrows, RD, LDN, [place] Pager [phone] After Hours Pager [phone]
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
## 2013-12-21 MED ORDER — TRAMADOL HCL 50 MG PO TABS
50.0000 mg | ORAL_TABLET | Freq: Four times a day (QID) | ORAL | Status: DC
  Administered 2013-12-21 – 2013-12-22 (×4): 50 mg via ORAL
  Filled 2013-12-21 (×4): qty 1

## 2013-12-21 MED ORDER — DOXYCYCLINE HYCLATE 100 MG PO TABS
100.0000 mg | ORAL_TABLET | Freq: Two times a day (BID) | ORAL | Status: DC
  Administered 2013-12-21 (×2): 100 mg via ORAL
  Filled 2013-12-21 (×4): qty 1

## 2013-12-21 MED ORDER — METFORMIN HCL 500 MG PO TABS
250.0000 mg | ORAL_TABLET | Freq: Two times a day (BID) | ORAL | Status: DC
  Administered 2013-12-21 – 2013-12-22 (×2): 250 mg via ORAL
  Filled 2013-12-21 (×5): qty 1

## 2013-12-21 MED ORDER — SODIUM CHLORIDE 0.9 % IJ SOLN: 3.0000 mL | INTRAMUSCULAR | Status: DC | PRN

## 2013-12-21 MED ORDER — SODIUM CHLORIDE 0.9 % IJ SOLN
3.0000 mL | Freq: Two times a day (BID) | INTRAMUSCULAR | Status: DC
  Administered 2013-12-21 (×2): 3 mL via INTRAVENOUS

## 2013-12-21 MED ORDER — SODIUM CHLORIDE 0.9 % IV SOLN: 250.0000 mL | INTRAVENOUS | Status: DC | PRN

## 2013-12-21 MED ORDER — OXYCODONE-ACETAMINOPHEN 5-325 MG PO TABS
1.0000 | ORAL_TABLET | Freq: Three times a day (TID) | ORAL | Status: DC | PRN
  Administered 2013-12-21 – 2013-12-22 (×3): 1 via ORAL
  Filled 2013-12-21 (×3): qty 1
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
## 2013-12-22 LAB — CBC WITH DIFFERENTIAL/PLATELET
BASOS ABS: 0 10*3/uL (ref 0.0–0.1)
Basophils Relative: 0 % (ref 0–1)
EOS ABS: 0.4 10*3/uL (ref 0.0–0.7)
Eosinophils Relative: 3 % (ref 0–5)
HCT: 38 % (ref 36.0–46.0)
HEMOGLOBIN: 12.4 g/dL (ref 12.0–15.0)
Lymphocytes Relative: 31 % (ref 12–46)
Lymphs Abs: 3.4 10*3/uL (ref 0.7–4.0)
MCH: 27.9 pg (ref 26.0–34.0)
MCHC: 32.6 g/dL (ref 30.0–36.0)
MCV: 85.6 fL (ref 78.0–100.0)
MONOS PCT: 6 % (ref 3–12)
Monocytes Absolute: 0.7 10*3/uL (ref 0.1–1.0)
NEUTROS ABS: 6.6 10*3/uL (ref 1.7–7.7)
NEUTROS PCT: 60 % (ref 43–77)
PLATELETS: 380 10*3/uL (ref 150–400)
RBC: 4.44 MIL/uL (ref 3.87–5.11)
RDW: 14.5 % (ref 11.5–15.5)
WBC: 11 10*3/uL — ABNORMAL HIGH (ref 4.0–10.5)

## 2013-12-22 MED ORDER — DOXYCYCLINE HYCLATE 100 MG PO TABS: 100.0000 mg | ORAL_TABLET | Freq: Two times a day (BID) | ORAL | Status: DC

## 2013-12-22 MED ORDER — METFORMIN HCL 500 MG PO TABS: 250.0000 mg | ORAL_TABLET | Freq: Two times a day (BID) | ORAL | Status: AC
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
## 2013-12-23 ENCOUNTER — Encounter (INDEPENDENT_AMBULATORY_CARE_PROVIDER_SITE_OTHER): Payer: Self-pay | Admitting: General Surgery

## 2013-12-29 ENCOUNTER — Ambulatory Visit (INDEPENDENT_AMBULATORY_CARE_PROVIDER_SITE_OTHER): Payer: Self-pay | Admitting: General Surgery

## 2013-12-29 ENCOUNTER — Encounter (INDEPENDENT_AMBULATORY_CARE_PROVIDER_SITE_OTHER): Payer: Self-pay | Admitting: General Surgery

## 2013-12-29 VITALS — BP 125/81 | HR 76 | Temp 98.6°F | Resp 18 | Ht 66.0 in | Wt 262.4 lb

## 2013-12-29 DIAGNOSIS — K8000 Calculus of gallbladder with acute cholecystitis without obstruction: Secondary | ICD-10-CM

## 2013-12-29 NOTE — Progress Notes (Signed)
Subjective:     Patient ID: Tasha Flores, female   DOB: 08/08/1981, 33 y.o.   MRN: [id number]  HPI The patient is a 33 year old white female who is about a month status post laparoscopic cholecystectomy for cholecystitis. She has done well from abdominal standpoint. Unfortunately after surgery she got readmitted with cellulitis in her left leg which is probably related to an old trauma. Her appetite is good and her bowels are working normally.  Review of Systems     Objective:   Physical Exam On exam her abdomen is soft and nontender. Her incisions are all healing nicely with no sign of infection. She does have significant cellulitis of her left leg where her graft is    Assessment:     The patient is one month status post laparoscopic cholecystectomy     Plan:     At this point we will plan to see her back on a when necessary basis. She may return to her normal activities. I have encouraged her to call her medical doctor to day of the cellulitis. She may need to go to the emergency department again admitted. She understands this.

## 2013-12-29 NOTE — Patient Instructions (Signed)
Call medical doc today about cellulitis of left leg

## 2014-01-29 ENCOUNTER — Ambulatory Visit (INDEPENDENT_AMBULATORY_CARE_PROVIDER_SITE_OTHER): Payer: BC Managed Care – PPO | Admitting: Internal Medicine

## 2014-01-29 ENCOUNTER — Encounter: Payer: Self-pay | Admitting: Internal Medicine

## 2014-01-29 VITALS — BP 131/92 | HR 74 | Temp 98.4°F | Wt 245.0 lb

## 2014-01-29 DIAGNOSIS — L0291 Cutaneous abscess, unspecified: Secondary | ICD-10-CM

## 2014-01-29 DIAGNOSIS — L0390 Cellulitis, unspecified: Secondary | ICD-10-CM

## 2014-01-29 MED ORDER — CHLORHEXIDINE GLUCONATE 4 % EX LIQD: CUTANEOUS | Status: AC

## 2014-01-29 MED ORDER — MUPIROCIN 2 % EX OINT
1.0000 | TOPICAL_OINTMENT | Freq: Three times a day (TID) | CUTANEOUS | Status: AC
Start: 2014-01-29 — End: ?
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
## 2014-02-04 ENCOUNTER — Encounter: Payer: Self-pay | Admitting: Internal Medicine

## 2014-02-04 ENCOUNTER — Telehealth: Payer: Self-pay | Admitting: *Deleted

## 2014-02-04 ENCOUNTER — Ambulatory Visit (INDEPENDENT_AMBULATORY_CARE_PROVIDER_SITE_OTHER): Payer: BC Managed Care – PPO | Admitting: Internal Medicine

## 2014-02-04 VITALS — BP 142/83 | HR 94 | Temp 98.4°F | Ht 66.0 in | Wt 259.0 lb

## 2014-02-04 DIAGNOSIS — L0390 Cellulitis, unspecified: Secondary | ICD-10-CM

## 2014-02-04 DIAGNOSIS — L0291 Cutaneous abscess, unspecified: Secondary | ICD-10-CM

## 2014-02-04 MED ORDER — SULFAMETHOXAZOLE-TMP DS 800-160 MG PO TABS: 2.0000 | ORAL_TABLET | Freq: Two times a day (BID) | ORAL | Status: AC
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
## 2014-02-04 NOTE — Telephone Encounter (Signed)
Patient called as instructed - states her symptoms have returned.  Swelling began late yesterday afternoon.  Pt given appointment with Dr. Drue SecondSnider for this morning 11:00. Andree CossHowell, Suzann Lazaro M, RN

## 2014-02-06 ENCOUNTER — Telehealth: Payer: Self-pay | Admitting: *Deleted

## 2014-02-06 NOTE — Telephone Encounter (Signed)
Patient was told to call Dr. Drue SecondSnider  to give an update on her symptoms.  She said her leg is much worse, very painful and swollen. She states she is miserable. Wendall MolaJacqueline Capricia Serda

## 2014-02-06 NOTE — Telephone Encounter (Signed)
Dr. Drue SecondSnider spoke with patient. Wendall MolaJacqueline Sherian Valenza

## 2014-02-11 ENCOUNTER — Ambulatory Visit: Payer: BC Managed Care – PPO | Admitting: Internal Medicine

## 2014-09-11 ENCOUNTER — Other Ambulatory Visit: Payer: Self-pay

## 2014-10-14 IMAGING — RF DG CHOLANGIOGRAM OPERATIVE
1 series · 4 of 4 positions shown · non-contrast
Comparison: 12/03/2013 ultrasound

CLINICAL DATA: Cholelithiasis, status post laparoscopic
cholecystectomy

EXAM:
INTRAOPERATIVE CHOLANGIOGRAM
TECHNIQUE: Cholangiographic images from the C-arm fluoroscopic device were
submitted for interpretation post-operatively. Please see the
procedural report for the amount of contrast and the fluoroscopy
time utilized.

[Series 1: run · 4 of 69 frames shown]
[frame 11/69]
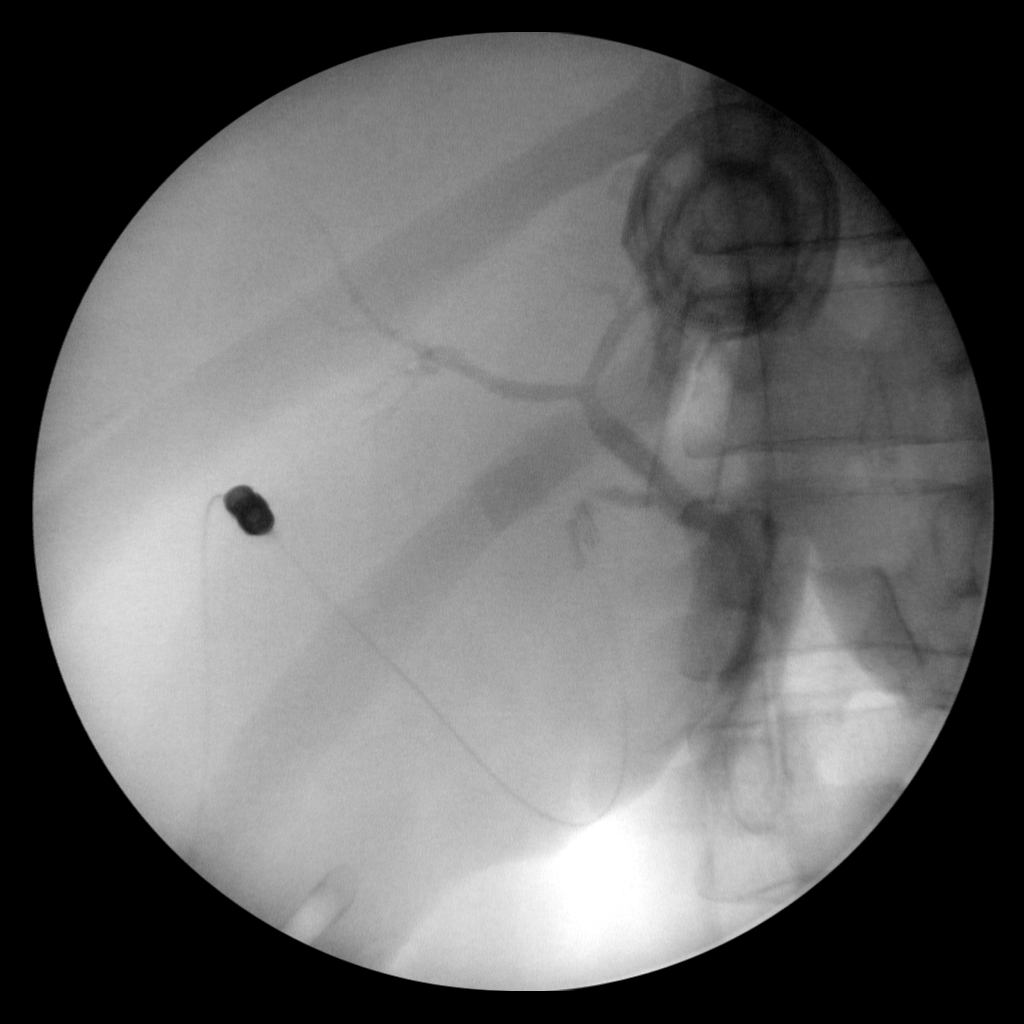
[frame 35/69]
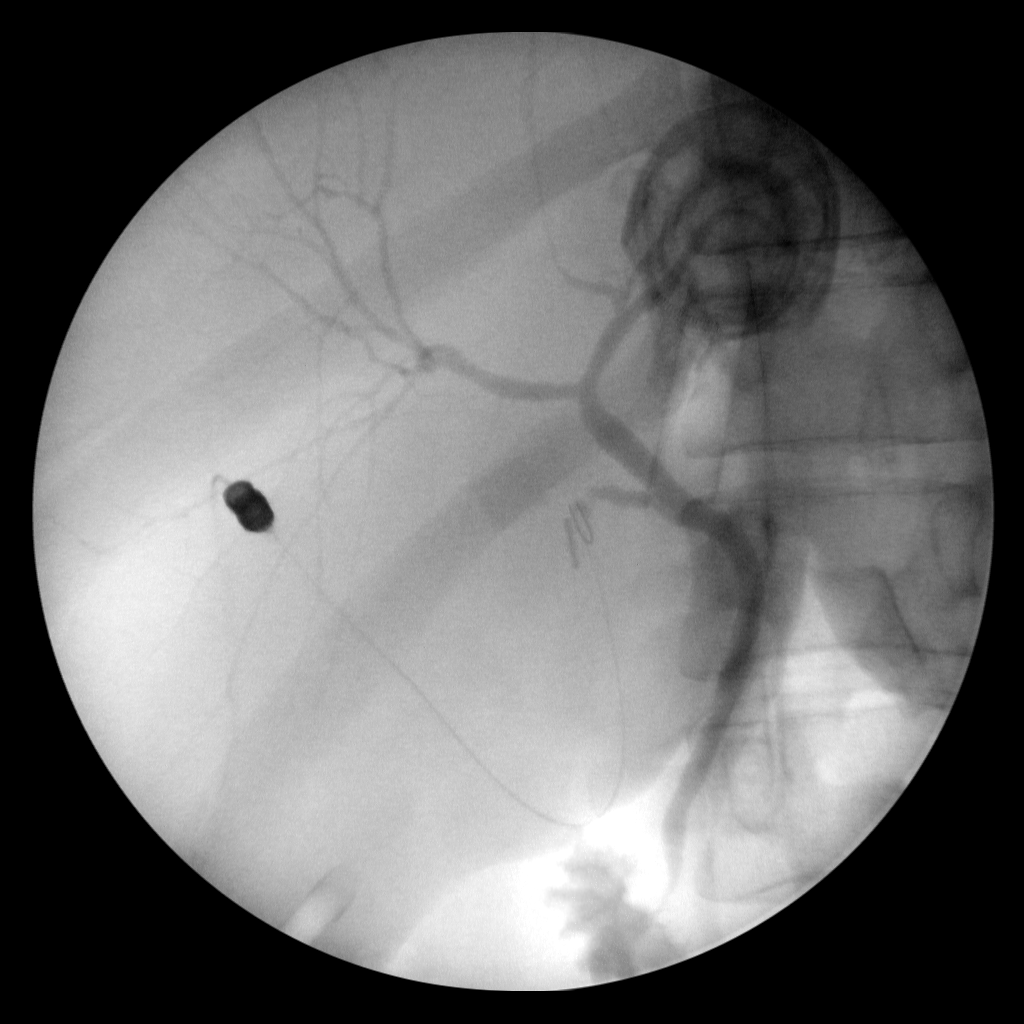
[frame 59/69]
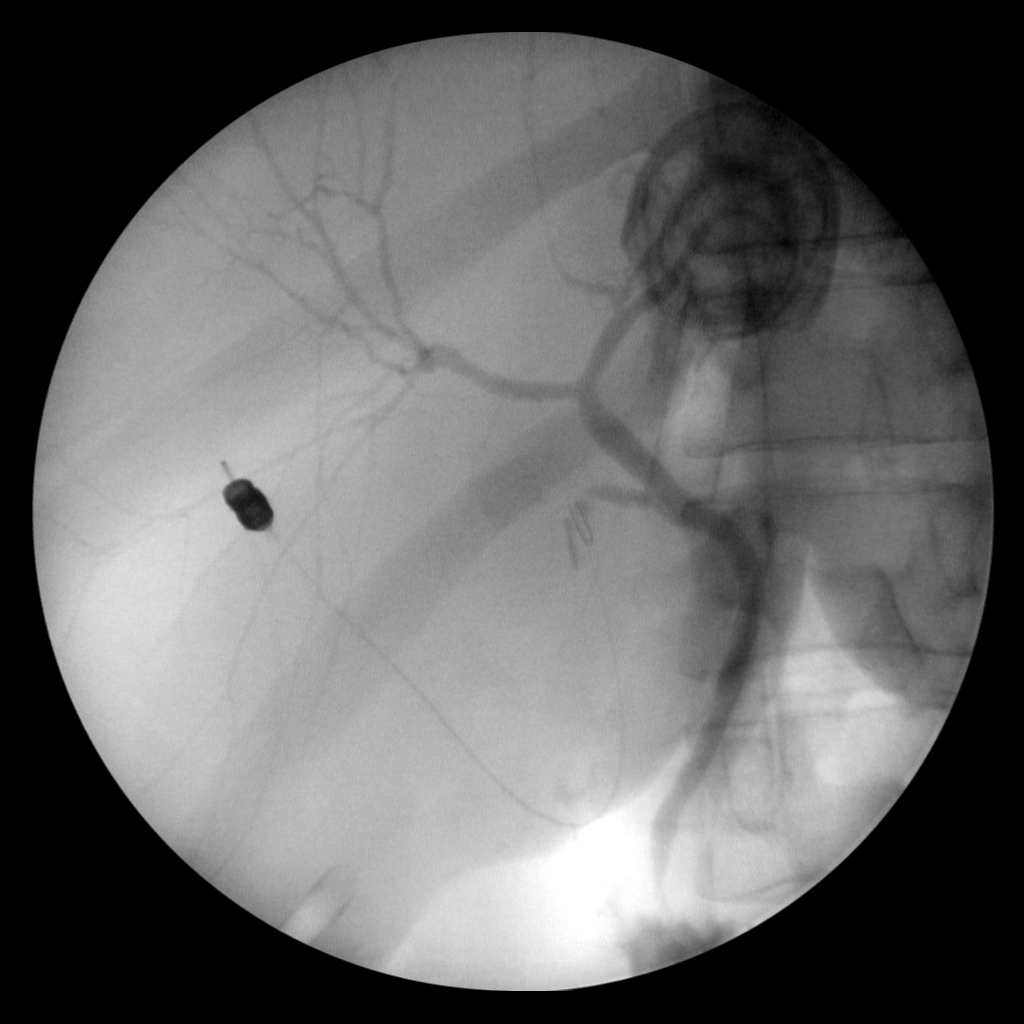
[frame 63/69]
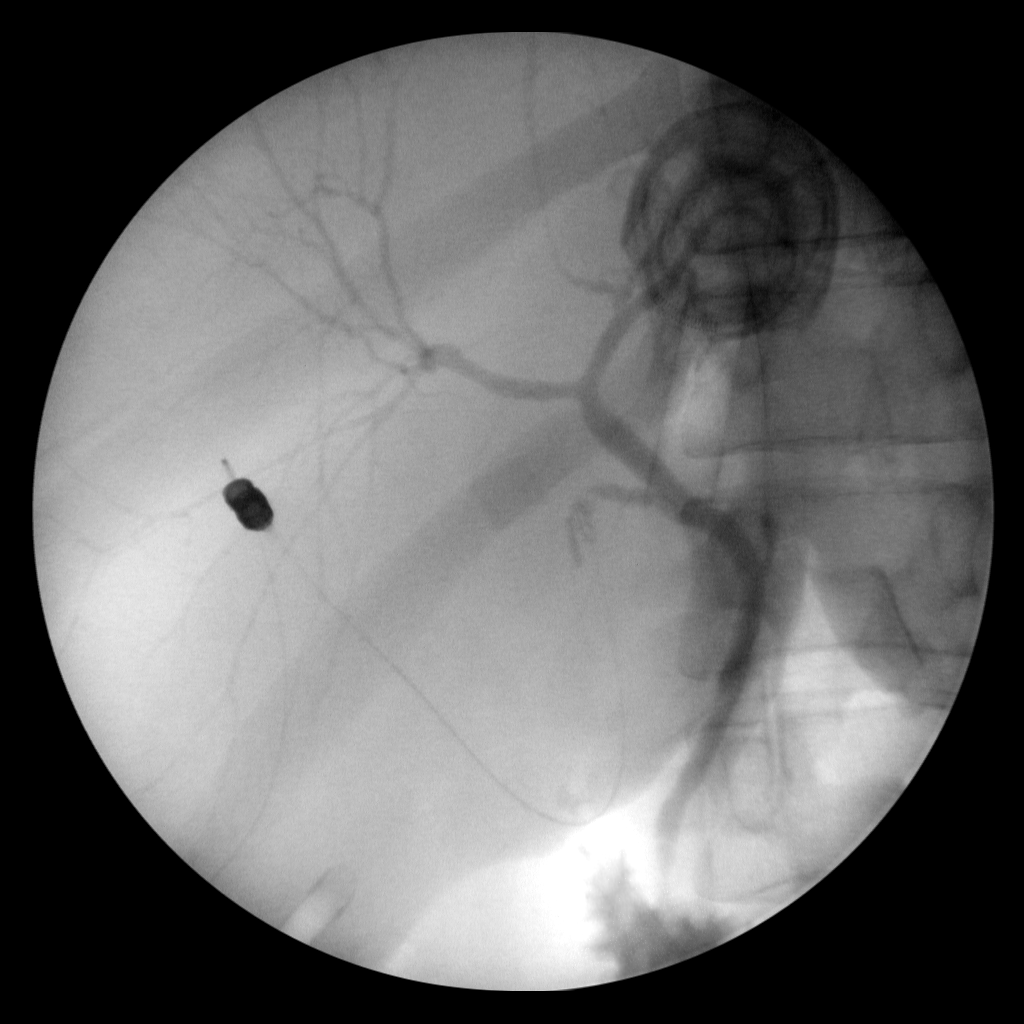

[4 of 4 positions shown; findings below may reference images not displayed]

FINDINGS: Intraoperative cholangiogram performed during laparoscopic
cholecystectomy. The intrahepatic ducts, biliary confluence, common
hepatic duct, residual cystic duct, and common bile duct are all
patent. Negative for dilatation, obstruction, filling defect or
stone. Contrast spontaneously drains into the duodenum.
IMPRESSION: Patent biliary system

## 2014-10-14 IMAGING — US US ABDOMEN COMPLETE
1 series · 13 of 25 positions shown · non-contrast
Comparison: Abdominal ultrasound 11/24/2013.

CLINICAL DATA: Right upper quadrant abdominal pain. Nausea and
vomiting.

EXAM:
ULTRASOUND ABDOMEN COMPLETE

[Series 1: us abdomen complete · 0.43mm/px · 13 of 68 slices shown]
[im 1/68]
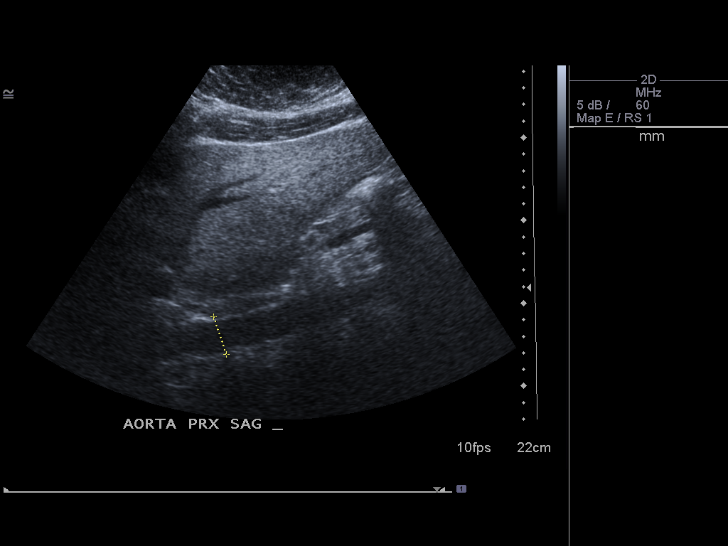
[im 6/68]
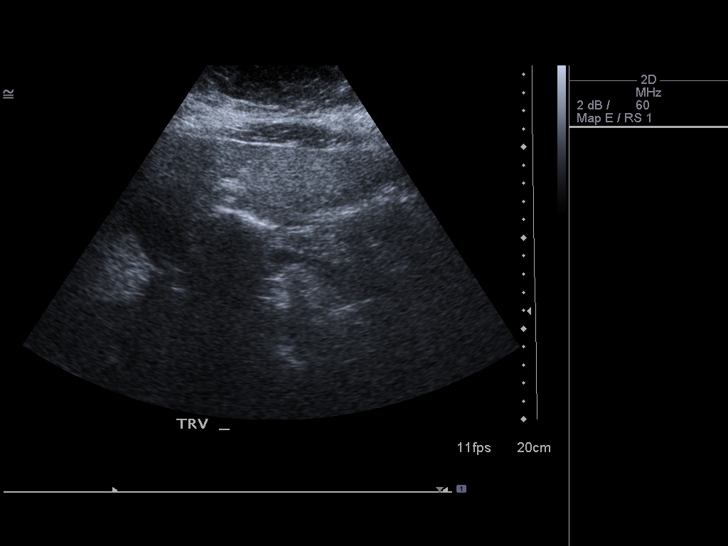
[im 12/68]
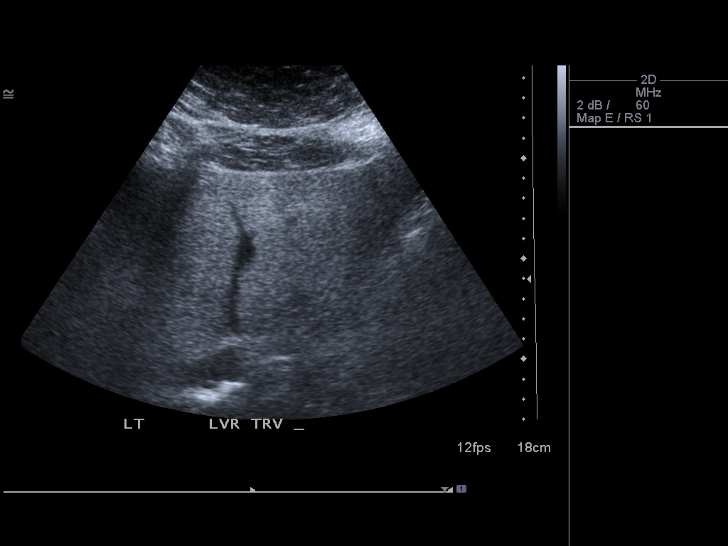
[im 17/68]
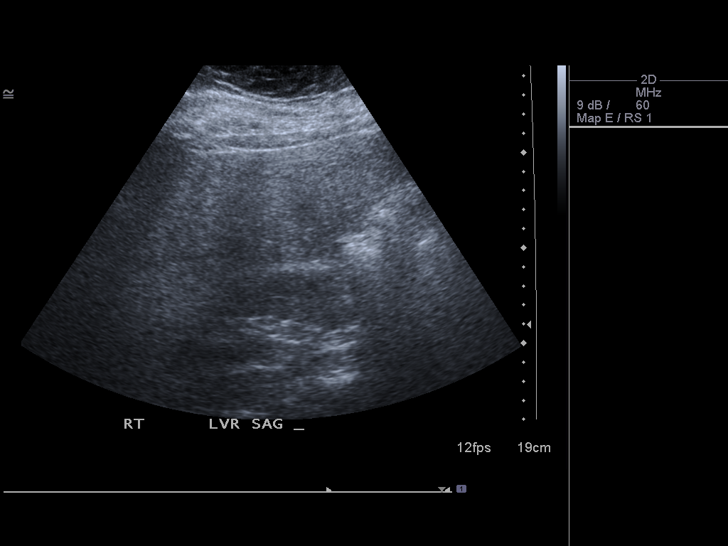
[im 23/68]
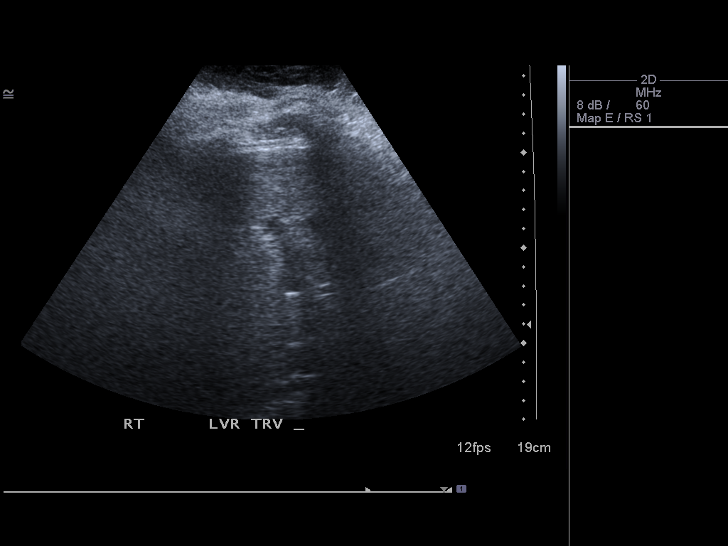
[im 28/68]
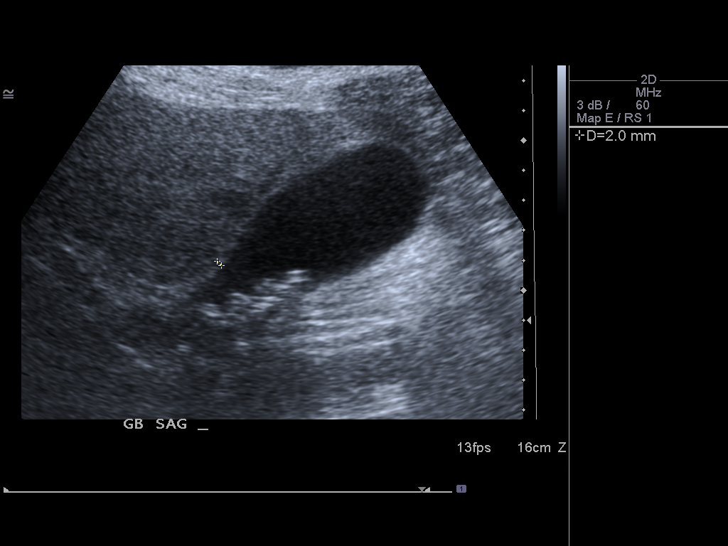
[im 34/68]
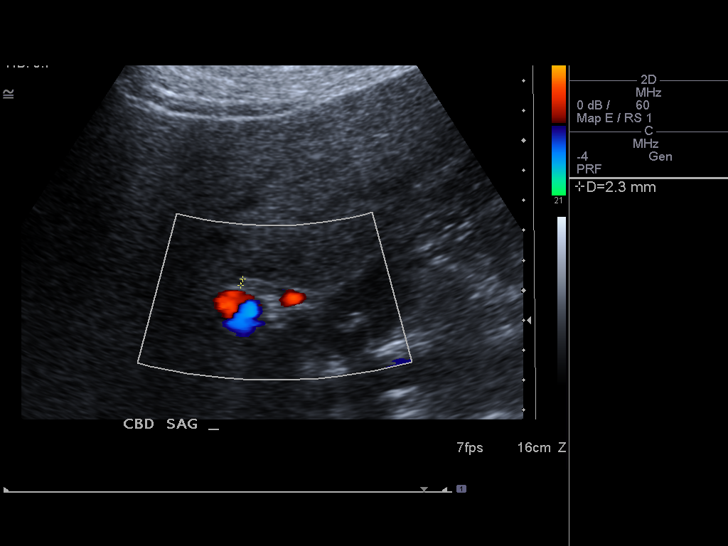
[im 40/68]
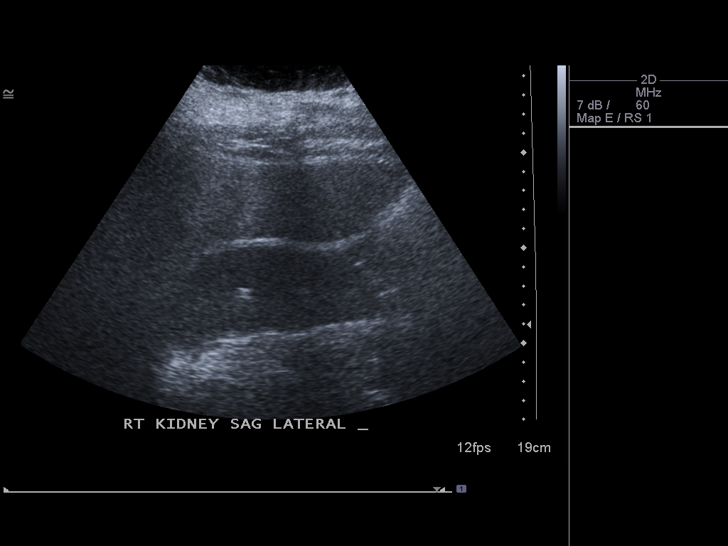
[im 45/68]
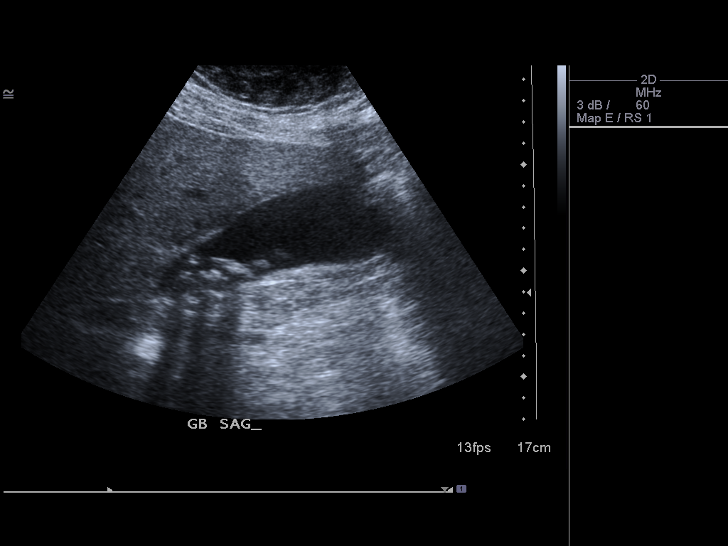
[im 51/68]
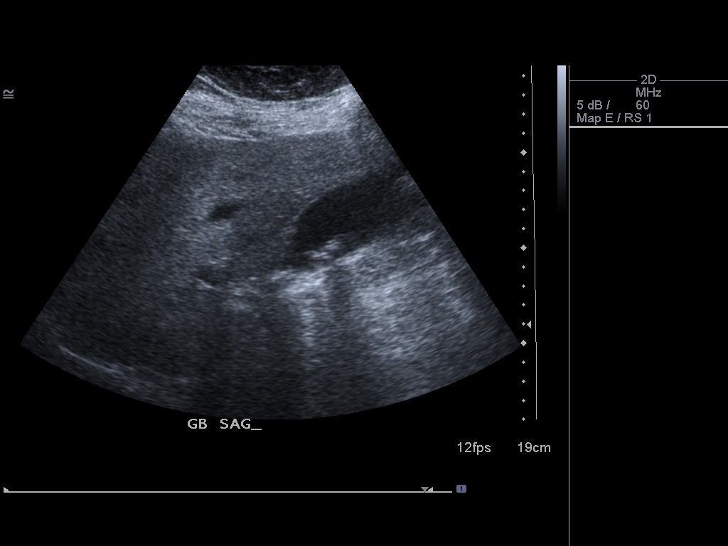
[im 56/68]
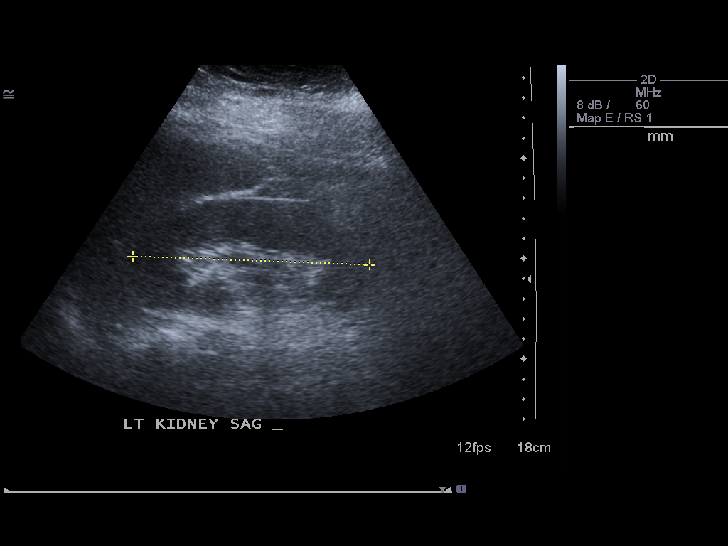
[im 62/68]
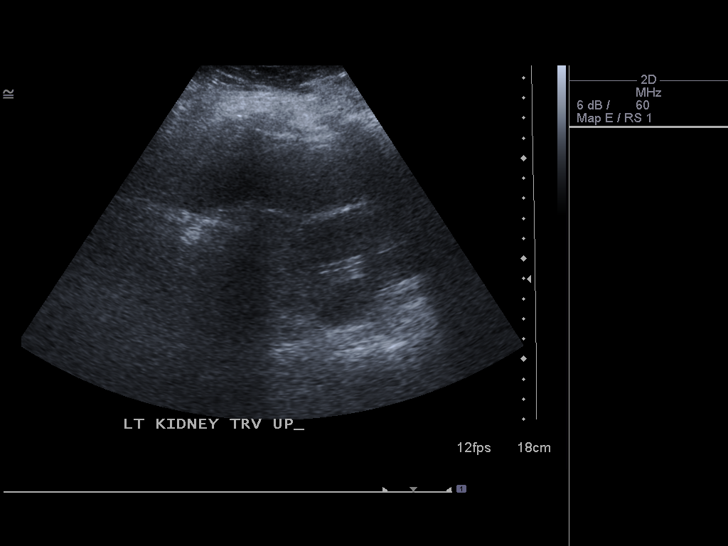
[im 68/68]
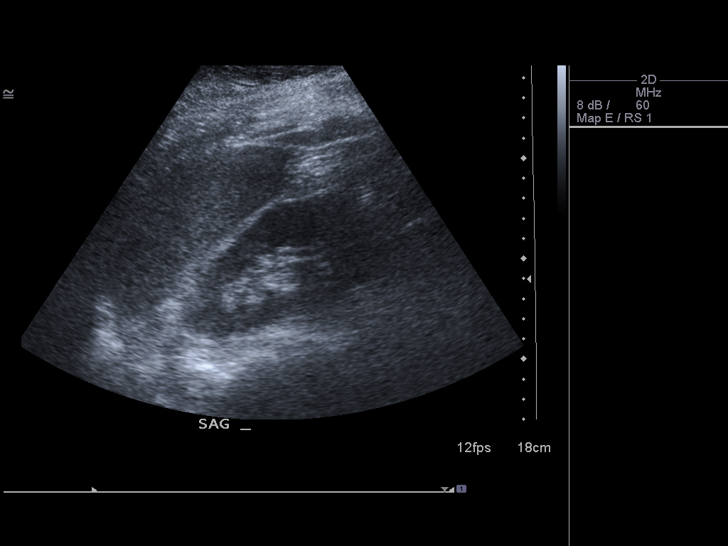

[13 of 25 positions shown; findings below may reference images not displayed]

FINDINGS: Gallbladder:

Multiple dependent echogenic foci with posterior acoustic shadowing
are noted within the lumen of the gallbladder, compatible with
gallstones. However, the gallbladder does not appear distended.
Gallbladder wall thickness is normal at 2 mm. No abnormal
pericholecystic fluid. Per report from the sonographer, the patient
did have a positive sonographic Murphy's sign on examination.

Common bile duct:

Diameter: 2.3 mm in the porta hepatis. Distal common bile duct could
not be visualized.

Liver:

No focal lesion identified. Diffusely echogenic hepatic parenchyma,
suggestive of hepatic steatosis..

IVC:

Poorly visualized secondary to overlying bowel gas.

Pancreas:

Poorly visualized secondary to overlying bowel gas.

Spleen:

10.6 cm in length.  Unremarkable.

Right Kidney:

Length: 11.5 cm. Echogenicity within normal limits. No mass or
hydronephrosis visualized.

Left Kidney:

Length: 11.8 cm. Echogenicity within normal limits. No mass or
hydronephrosis visualized.

Abdominal aorta:

Poorly visualized, but measures up to 2.4 cm in diameter proximally,
and appears to taper distally.

Other findings:

None.
IMPRESSION: 1. Study is positive for cholelithiasis. This patient did
demonstrate a positive sonographic Murphy's sign per report from the
sonographer, however, the gallbladder does not appear distended at
this time, and there is no gallbladder wall thickening or
pericholecystic fluid. Overall, findings are equivocal for evidence
of acute cholecystitis, and clinical correlation is recommended.
2. Echogenic hepatic parenchyma suggestive of hepatic steatosis.

## 2015-04-20 ENCOUNTER — Telehealth (HOSPITAL_COMMUNITY): Payer: Self-pay

## 2016-04-09 ENCOUNTER — Emergency Department (HOSPITAL_COMMUNITY)
Admission: EM | Admit: 2016-04-09 | Discharge: 2016-04-10 | Disposition: A | Discharge status: Home / Self Care | Payer: BLUE CROSS/BLUE SHIELD | Attending: Emergency Medicine | Admitting: Emergency Medicine

## 2016-04-09 ENCOUNTER — Encounter (HOSPITAL_COMMUNITY): Payer: Self-pay

## 2016-04-09 ENCOUNTER — Emergency Department (HOSPITAL_COMMUNITY): Payer: BLUE CROSS/BLUE SHIELD

## 2016-04-09 DIAGNOSIS — R109 Unspecified abdominal pain: Secondary | ICD-10-CM | POA: Diagnosis present

## 2016-04-09 DIAGNOSIS — R110 Nausea: Secondary | ICD-10-CM | POA: Insufficient documentation

## 2016-04-09 DIAGNOSIS — F17210 Nicotine dependence, cigarettes, uncomplicated: Secondary | ICD-10-CM | POA: Insufficient documentation

## 2016-04-09 DIAGNOSIS — N201 Calculus of ureter: Secondary | ICD-10-CM | POA: Diagnosis not present

## 2016-04-09 DIAGNOSIS — Z9049 Acquired absence of other specified parts of digestive tract: Secondary | ICD-10-CM | POA: Insufficient documentation

## 2016-04-09 DIAGNOSIS — R61 Generalized hyperhidrosis: Secondary | ICD-10-CM | POA: Insufficient documentation

## 2016-04-09 DIAGNOSIS — Z8719 Personal history of other diseases of the digestive system: Secondary | ICD-10-CM | POA: Insufficient documentation

## 2016-04-09 DIAGNOSIS — Z79899 Other long term (current) drug therapy: Secondary | ICD-10-CM | POA: Insufficient documentation

## 2016-04-09 DIAGNOSIS — Z8659 Personal history of other mental and behavioral disorders: Secondary | ICD-10-CM | POA: Insufficient documentation

## 2016-04-09 DIAGNOSIS — Z3202 Encounter for pregnancy test, result negative: Secondary | ICD-10-CM | POA: Diagnosis not present

## 2016-04-09 LAB — COMPREHENSIVE METABOLIC PANEL
ALBUMIN: 4.7 g/dL (ref 3.5–5.0)
ALT: 28 U/L (ref 14–54)
ANION GAP: 6 (ref 5–15)
AST: 21 U/L (ref 15–41)
Alkaline Phosphatase: 67 U/L (ref 38–126)
BILIRUBIN TOTAL: 0.6 mg/dL (ref 0.3–1.2)
BUN: 15 mg/dL (ref 6–20)
CO2: 24 mmol/L (ref 22–32)
Calcium: 9.8 mg/dL (ref 8.9–10.3)
Chloride: 106 mmol/L (ref 101–111)
Creatinine, Ser: 0.78 mg/dL (ref 0.44–1.00)
GFR calc non Af Amer: >60 mL/min (ref >60)
Glucose, Bld: 157 mg/dL — ABNORMAL HIGH (ref 65–99)
Potassium: 4.4 mmol/L (ref 3.5–5.1)
Sodium: 136 mmol/L (ref 135–145)
TOTAL PROTEIN: 8.4 g/dL — AB (ref 6.5–8.1)

## 2016-04-09 LAB — CBC
HEMATOCRIT: 41.6 % (ref 36.0–46.0)
HEMOGLOBIN: 14.2 g/dL (ref 12.0–15.0)
MCH: 28.5 pg (ref 26.0–34.0)
MCHC: 34.1 g/dL (ref 30.0–36.0)
MCV: 83.4 fL (ref 78.0–100.0)
Platelets: 502 10*3/uL — ABNORMAL HIGH (ref 150–400)
RBC: 4.99 MIL/uL (ref 3.87–5.11)
RDW: 13.9 % (ref 11.5–15.5)
WBC: 14.3 10*3/uL — ABNORMAL HIGH (ref 4.0–10.5)

## 2016-04-09 LAB — URINALYSIS, ROUTINE W REFLEX MICROSCOPIC
BILIRUBIN URINE: NEGATIVE
Glucose, UA: NEGATIVE mg/dL
Hgb urine dipstick: NEGATIVE
Ketones, ur: NEGATIVE mg/dL
Leukocytes, UA: NEGATIVE
NITRITE: NEGATIVE
Protein, ur: NEGATIVE mg/dL
SPECIFIC GRAVITY, URINE: 1.023 (ref 1.005–1.030)
pH: 7.5 (ref 5.0–8.0)

## 2016-04-09 LAB — LIPASE, BLOOD: Lipase: 18 U/L (ref 11–51)

## 2016-04-09 LAB — POC URINE PREG, ED: Preg Test, Ur: NEGATIVE

## 2016-04-09 MED ORDER — ONDANSETRON HCL 4 MG/2ML IJ SOLN
4.0000 mg | Freq: Once | INTRAMUSCULAR | Status: AC
  Administered 2016-04-09: 4 mg via INTRAVENOUS
  Filled 2016-04-09: qty 2

## 2016-04-09 MED ORDER — HYDROMORPHONE HCL 1 MG/ML IJ SOLN
1.0000 mg | Freq: Once | INTRAMUSCULAR | Status: AC
  Administered 2016-04-09: 1 mg via INTRAVENOUS
  Filled 2016-04-09: qty 1

## 2016-04-09 MED ORDER — IOPAMIDOL (ISOVUE-300) INJECTION 61%
100.0000 mL | Freq: Once | INTRAVENOUS | Status: AC | PRN
  Administered 2016-04-09: 100 mL via INTRAVENOUS

## 2016-04-09 MED ORDER — ONDANSETRON HCL 4 MG/2ML IJ SOLN
4.0000 mg | Freq: Once | INTRAMUSCULAR | Status: AC | PRN
  Administered 2016-04-09: 4 mg via INTRAVENOUS
  Filled 2016-04-09: qty 2

## 2016-04-09 MED ORDER — MORPHINE SULFATE (PF) 4 MG/ML IV SOLN
4.0000 mg | Freq: Once | INTRAVENOUS | Status: AC
  Administered 2016-04-09: 4 mg via INTRAVENOUS
  Filled 2016-04-09: qty 1

## 2016-04-09 NOTE — ED Provider Notes (Signed)
CSN: [id number]     Arrival date & time 04/09/16  2138 History   First MD Initiated Contact with Patient 04/09/16 2212     Chief Complaint  Patient presents with  . Abdominal Pain     (Consider location/radiation/quality/duration/timing/severity/associated sxs/prior Treatment) Patient is a 35 y.o. female presenting with abdominal pain. The history is provided by the patient and medical records.  Abdominal Pain Associated symptoms: nausea     35 year old female with history of ADD, presenting to the ED for abdominal pain. Patient reports she woke up this morning with mild mid abdominal pain. She states throughout the day pain is worsened and has become more localized to her right lower quadrant. She states she has felt hot and has been sweaty throughout the day today. She also reports nausea but denies vomiting. States her bowel movements have been normal. She denies any noted fever. No urinary symptoms, vaginal discharge, or pelvic pain. Prior abdominal surgeries include cholecystectomy in 2015 which was uncomplicated.  States she does have history of ovarian cysts, however this feels drastically different.  Past Medical History  Diagnosis Date  . ADD (attention deficit disorder)   . Gallstones   . PONV (postoperative nausea and vomiting)    Past Surgical History  Procedure Laterality Date  . Leg surgery    . Cholecystectomy N/A 12/03/2013    Procedure: LAPAROSCOPIC CHOLECYSTECTOMY WITH INTRAOPERATIVE CHOLANGIOGRAM;  Surgeon: Robyne Askew, MD;  Location: [facility];  Service: General;  Laterality: N/A;   History reviewed. No pertinent family history. Social History  Substance Use Topics  . Smoking status: Current Every Day Smoker -- 0.50 packs/day    Types: Cigarettes  . Smokeless tobacco: Never Used     Comment: trying to cut back  . Alcohol Use: 1.0 oz/week    2 drink(s) per week   OB History    No data available     Review of Systems  Constitutional: Positive for  diaphoresis.  Gastrointestinal: Positive for nausea and abdominal pain.  All other systems reviewed and are negative.     Allergies  Review of patient's allergies indicates no known allergies.  Home Medications   Prior to Admission medications   Medication Sig Start Date End Date Taking? Authorizing Provider  albuterol (PROVENTIL HFA;VENTOLIN HFA) 108 (90 BASE) MCG/ACT inhaler Inhale 2 puffs into the lungs every 6 (six) hours as needed for wheezing or shortness of breath.   Yes Historical Provider, MD  cephALEXin (KEFLEX) 500 MG capsule Take 500 mg by mouth 2 (two) times daily as needed (cellulitus).   Yes Historical Provider, MD  cetirizine (ZYRTEC) 10 MG tablet Take 10 mg by mouth daily as needed for allergies.   Yes Historical Provider, MD  naproxen sodium (ANAPROX) 220 MG tablet Take 220 mg by mouth as needed (pain).   Yes Historical Provider, MD  VYVANSE 70 MG capsule Take 70 mg by mouth daily. 03/31/16  Yes Historical Provider, MD  chlorhexidine (HIBICLENS) 4 % external liquid Apply topically as directed. Apply head to toe as daily wash x 10 days Patient not taking: Reported on 04/09/2016 01/29/14   Judyann Munson, MD  ibuprofen (ADVIL,MOTRIN) 200 MG tablet Take 400 mg by mouth every 6 (six) hours as needed for mild pain or moderate pain.    Historical Provider, MD  metFORMIN (GLUCOPHAGE) 500 MG tablet Take 0.5 tablets (250 mg total) by mouth 2 (two) times daily with a meal. Patient not taking: Reported on 04/09/2016 12/22/13   Rhetta Mura, MD  mupirocin ointment (BACTROBAN) 2 % Place 1 application into the nose 3 (three) times daily. As directed x 10 days Patient not taking: Reported on 04/09/2016 01/29/14   Judyann Munson, MD  sulfamethoxazole-trimethoprim (BACTRIM DS) 800-160 MG per tablet Take 2 tablets by mouth 2 (two) times daily. Patient not taking: Reported on 04/09/2016 02/04/14   Judyann Munson, MD   BP 134/81 mmHg  Pulse 65  Temp(Src) 98.4 F (36.9 C) (Oral)  Resp 19   Ht 5\' 6"  (1.676 m)  Wt 117.935 kg  BMI 41.99 kg/m2  SpO2 96%  LMP 03/27/2016   Physical Exam  Constitutional: She is oriented to person, place, and time. She appears well-developed and well-nourished. No distress.  HENT:  Head: Normocephalic and atraumatic.  Mouth/Throat: Oropharynx is clear and moist.  Eyes: Conjunctivae and EOM are normal. Pupils are equal, round, and reactive to light.  Neck: Normal range of motion. Neck supple.  Cardiovascular: Normal rate, regular rhythm and normal heart sounds.   Pulmonary/Chest: Effort normal and breath sounds normal. No respiratory distress. She has no wheezes. She has no rhonchi. She has no rales.  Abdominal: Soft. Bowel sounds are normal. There is tenderness in the right lower quadrant. There is tenderness at McBurney's point. There is no guarding and no CVA tenderness.  TTP in RLQ at McBurney's point; voluntary guarding noted  Musculoskeletal: Normal range of motion.  Neurological: She is alert and oriented to person, place, and time.  Skin: Skin is warm and dry. She is not diaphoretic.  Skin is flushed and clammy  Psychiatric: She has a normal mood and affect.  Nursing note and vitals reviewed.   ED Course  Procedures (including critical care time) Labs Review Labs Reviewed  COMPREHENSIVE METABOLIC PANEL - Abnormal; Notable for the following:    Glucose, Bld 157 (*)    Total Protein 8.4 (*)    All other components within normal limits  CBC - Abnormal; Notable for the following:    WBC 14.3 (*)    Platelets 502 (*)    All other components within normal limits  LIPASE, BLOOD  URINALYSIS, ROUTINE W REFLEX MICROSCOPIC (NOT AT [facility])  POC URINE PREG, ED    Imaging Review Ct Abdomen Pelvis W Contrast  04/09/2016  CLINICAL DATA:  Right lower quadrant pain and tenderness, diaphoresis. Recent antibiotics for cellulitis. Nausea. EXAM: CT ABDOMEN AND PELVIS WITH CONTRAST TECHNIQUE: Multidetector CT imaging of the abdomen and pelvis was  performed using the standard protocol following bolus administration of intravenous contrast. CONTRAST:  ISOVUE-300 IOPAMIDOL (ISOVUE-300) INJECTION 61% COMPARISON:  None. FINDINGS: Lung bases are clear. Surgical absence of the gallbladder. No bile duct dilatation. The liver, spleen, pancreas, adrenal glands, abdominal aorta, inferior vena cava, and retroperitoneal lymph nodes are unremarkable. There is a 2 mm stone in the distal right ureter at the ureterovesical junction. There is proximal hydronephrosis and hydroureter on the right. The right renal nephrogram is delayed and there is infiltration around the right kidney and ureter. Changes are consistent with moderate obstruction. The left kidney and ureter are unremarkable. Stomach, small bowel, and colon are not abnormally distended. Stool fills the colon. No free air or free fluid in the abdomen. Minimal periumbilical hernia containing fat. Pelvis: The appendix is normal. Small amount of free fluid in the pelvis may be physiologic or reactive. Uterus and ovaries are not enlarged. No pelvic mass or lymphadenopathy. Bladder wall is not thickened. No destructive bone lesions. IMPRESSION: 2 mm stone in the distal right ureter  with moderate proximal obstruction. Electronically Signed   By: Burman NievesWilliam  Stevens M.D.   On: 04/09/2016 23:46   I have personally reviewed and evaluated these images and lab results as part of my medical decision-making.   EKG Interpretation None      MDM   Final diagnoses:  Ureteral stone  Abdominal pain, unspecified abdominal location  Nausea   35 year old female here with right lower quadrant abdominal pain, progressively worsening throughout the day today. Patient is afebrile, nontoxic. She does appear flushed and is clammy on exam. She has tenderness in the right lower quadrant. No CVA tenderness. Labs with leukocytosis noted. UA without signs of infection. CT scan was obtained which reveals a 2 mm stone in the distal  right ureter which is likely the cause of her symptoms. Patient's pain currently controlled in the ED. She's had no further nausea and no active vomiting here. Will discharge home with Percocet, Flomax, and Zofran. She is also given urine strainer to monitor for passage of stone.  Given urology follow-up. Discussed plan with patient, he/she acknowledged understanding and agreed with plan of care.  Return precautions given for new or worsening symptoms.  Garlon HatchetLisa M Adonay Scheier, PA-C 04/10/16 0029  Tilden FossaElizabeth Rees, MD 04/10/16 2252

## 2016-04-09 NOTE — ED Notes (Signed)
Pt assisted to BR by RN

## 2016-04-09 NOTE — ED Notes (Signed)
Bed: WA08 Expected date:  Expected time:  Means of arrival:  Comments: [age] F abd pain

## 2016-04-09 NOTE — ED Notes (Signed)
According to EMS, pt awoke w/ progressive abd pain and tenderness RLQ, diaphoresis. Pt has recently completed a course of IV abx for cellulitis. Pt reports nausea. Pt denies diarrhea and constipation. Pt A+OX4, speaking in complete sentences.

## 2016-04-10 MED ORDER — TAMSULOSIN HCL 0.4 MG PO CAPS: 0.4000 mg | ORAL_CAPSULE | Freq: Every day | ORAL | Status: AC

## 2016-04-10 MED ORDER — ONDANSETRON 4 MG PO TBDP: 4.0000 mg | ORAL_TABLET | Freq: Three times a day (TID) | ORAL | Status: AC | PRN

## 2016-04-10 MED ORDER — OXYCODONE-ACETAMINOPHEN 5-325 MG PO TABS: 1.0000 | ORAL_TABLET | ORAL | Status: AC | PRN

## 2016-04-10 NOTE — Discharge Instructions (Signed)
Take the prescribed medication as directed.  Use caution when taking percocet, it can make you sleepy/drowsy. Strain urine to monitor for passage of stone. Follow-up with urology-- call to make appt. Return to the ED for new or worsening symptoms.

## 2016-10-25 ENCOUNTER — Ambulatory Visit: Payer: BLUE CROSS/BLUE SHIELD | Attending: Family Medicine | Admitting: Physical Therapy

## 2016-10-31 ENCOUNTER — Telehealth: Payer: Self-pay | Admitting: Physical Therapy

## 2016-10-31 NOTE — Telephone Encounter (Signed)
10/25/16 no show for PT eval 10/31/16 spoke with patient, has celllicutis

## 2016-11-09 ENCOUNTER — Ambulatory Visit: Payer: BLUE CROSS/BLUE SHIELD | Attending: Family Medicine | Admitting: Physical Therapy

## 2016-11-09 ENCOUNTER — Encounter: Payer: Self-pay | Admitting: Physical Therapy

## 2016-11-09 DIAGNOSIS — M545 Low back pain: Secondary | ICD-10-CM | POA: Diagnosis not present

## 2016-11-09 DIAGNOSIS — G8929 Other chronic pain: Secondary | ICD-10-CM | POA: Diagnosis present

## 2016-11-09 DIAGNOSIS — M62830 Muscle spasm of back: Secondary | ICD-10-CM
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
## 2016-11-14 ENCOUNTER — Ambulatory Visit: Payer: BLUE CROSS/BLUE SHIELD | Admitting: Physical Therapy

## 2016-11-16 ENCOUNTER — Encounter: Payer: Self-pay | Admitting: Physical Therapy

## 2016-11-16 ENCOUNTER — Ambulatory Visit: Payer: BLUE CROSS/BLUE SHIELD | Admitting: Physical Therapy

## 2016-11-16 DIAGNOSIS — G8929 Other chronic pain: Secondary | ICD-10-CM

## 2016-11-16 DIAGNOSIS — M545 Low back pain: Secondary | ICD-10-CM

## 2016-11-16 DIAGNOSIS — M62830 Muscle spasm of back: Secondary | ICD-10-CM

## 2016-11-16 NOTE — Therapy (Addendum)
[address] [place], [place], [address] Phone: [phone]   Fax:  [phone]  Physical Therapy Treatment  Patient Details  Name: Tasha Flores MRN: [id number] Date of Birth: 11-Nov-1981 Referring Provider: Coletta Memos  Encounter Date: 11/16/2016      PT End of Session - 11/16/16 1000    Visit Number 2   Date for PT Re-Evaluation 01/10/17   PT Start Time 0945   PT Stop Time 1020   PT Time Calculation (min) 35 min   Activity Tolerance Patient tolerated treatment well   Behavior During Therapy [facility] for tasks assessed/performed      Past Medical History:  Diagnosis Date  . ADD (attention deficit disorder)   . Gallstones   . PONV (postoperative nausea and vomiting)     Past Surgical History:  Procedure Laterality Date  . CHOLECYSTECTOMY N/A 12/03/2013   Procedure: LAPAROSCOPIC CHOLECYSTECTOMY WITH INTRAOPERATIVE CHOLANGIOGRAM;  Surgeon: Merrie Roof, MD;  Location: [facility];  Service: General;  Laterality: N/A;  . LEG SURGERY      There were no vitals filed for this visit.      Subjective Assessment - 11/16/16 0942    Subjective "The back is better, that's a sometime thing"   Currently in Pain? Yes   Pain Score 7    Pain Location --  All of her skin from the mid thigh down                         [facility] Adult PT Treatment/Exercise - 11/16/16 0001      Exercises   Exercises Lumbar     Lumbar Exercises: Aerobic   Stationary Bike NuStep L4 x 48mn      Lumbar Exercises: Machines for Strengthening   Cybex Knee Extension 10lb 3x10   Cybex Knee Flexion 35lb 3x10     Lumbar Exercises: Standing   Other Standing Lumbar Exercises Row & lats 20lb 2x10     Modalities   Modalities Moist Heat;Electrical Stimulation     Moist Heat Therapy   Number Minutes Moist Heat 15 Minutes   Moist Heat Location Lumbar Spine     Electrical Stimulation   Electrical Stimulation Location  lumbar area   Electrical Stimulation Action IFC   Electrical Stimulation Parameters supine   Electrical Stimulation Goals Pain                  PT Short Term Goals - 11/09/16 1608      PT SHORT TERM GOAL #1   Title independent with initial HEP   Time 2   Period Weeks   Status New           PT Long Term Goals - 11/09/16 1608      PT LONG TERM GOAL #1   Title understand proper posture and body mechanics   Time 8   Period Weeks   Status New     PT LONG TERM GOAL #2   Title decrease pain 50%   Time 8   Period Weeks   Status New     PT LONG TERM GOAL #3   Title increase ROM 25%   Time 8   Period Weeks   Status New     PT LONG TERM GOAL #4   Title increase strength to 4/5   Time 8   Period Weeks   Status New  Plan - 11/16/16 1007    Clinical Impression Statement PT ~ 15 minutes late for today's session. Pt very hyper verbal throughout treatment, needs cues to be redirected. Reports that her back pain comes and goes. Reports that her back was getting aggravated with lat pull downs.   Rehab Potential Good   PT Frequency 2x / week   PT Duration 8 weeks   PT Treatment/Interventions ADLs/Self Care Home Management;Electrical Stimulation;Moist Heat;Therapeutic activities;Therapeutic exercise;Patient/family education;Manual techniques   PT Next Visit Plan slowly add exercises, modalities as needed      Patient will benefit from skilled therapeutic intervention in order to improve the following deficits and impairments:  Decreased activity tolerance, Decreased mobility, Decreased range of motion, Decreased strength, Increased muscle spasms, Impaired flexibility, Postural dysfunction, Improper body mechanics, Pain  Visit Diagnosis: Muscle spasm of back  Chronic midline low back pain without sciatica     Problem List Patient Active Problem List   Diagnosis Date Noted  . Cellulitis 12/18/2013  . Obesity 12/18/2013  . Tobacco abuse  12/18/2013  . Cholelithiasis and acute cholecystitis without obstruction 12/03/2013    PHYSICAL THERAPY DISCHARGE SUMMARY  Visits from Start of Care: 2 Plan: Patient agrees to discharge.  Patient goals were not met. Patient is being discharged due to being pleased with the current functional level.  ?????       Scot Jun, PTA 11/16/2016, 10:10 AM  [address] [place], [place], [address] Phone: [phone]   Fax:  [phone]  Name: Tasha Flores MRN: [id number] Date of Birth: 1981/05/07

## 2016-11-23 ENCOUNTER — Ambulatory Visit: Payer: BLUE CROSS/BLUE SHIELD | Admitting: Physical Therapy

## 2017-08-21 ENCOUNTER — Institutional Professional Consult (permissible substitution): Payer: Self-pay | Admitting: Neurology

## 2017-08-22 ENCOUNTER — Encounter: Payer: Self-pay | Admitting: Neurology

## 2019-05-03 ENCOUNTER — Emergency Department (HOSPITAL_COMMUNITY)
Admission: EM | Admit: 2019-05-03 | Discharge: 2019-05-03 | Disposition: A | Discharge status: Home / Self Care | Payer: BLUE CROSS/BLUE SHIELD | Attending: Emergency Medicine | Admitting: Emergency Medicine

## 2019-05-03 ENCOUNTER — Emergency Department (HOSPITAL_COMMUNITY): Payer: BLUE CROSS/BLUE SHIELD

## 2019-05-03 ENCOUNTER — Encounter (HOSPITAL_COMMUNITY): Payer: Self-pay

## 2019-05-03 ENCOUNTER — Other Ambulatory Visit: Payer: Self-pay

## 2019-05-03 DIAGNOSIS — Y999 Unspecified external cause status: Secondary | ICD-10-CM | POA: Insufficient documentation

## 2019-05-03 DIAGNOSIS — S60222A Contusion of left hand, initial encounter: Secondary | ICD-10-CM | POA: Diagnosis not present

## 2019-05-03 DIAGNOSIS — Y9389 Activity, other specified: Secondary | ICD-10-CM | POA: Insufficient documentation

## 2019-05-03 DIAGNOSIS — R0789 Other chest pain: Secondary | ICD-10-CM | POA: Insufficient documentation

## 2019-05-03 DIAGNOSIS — R51 Headache: Secondary | ICD-10-CM | POA: Insufficient documentation

## 2019-05-03 DIAGNOSIS — R0602 Shortness of breath: Secondary | ICD-10-CM | POA: Diagnosis not present

## 2019-05-03 DIAGNOSIS — Y92411 Interstate highway as the place of occurrence of the external cause: Secondary | ICD-10-CM | POA: Diagnosis not present

## 2019-05-03 DIAGNOSIS — S301XXA Contusion of abdominal wall, initial encounter: Secondary | ICD-10-CM | POA: Diagnosis not present

## 2019-05-03 DIAGNOSIS — S20319A Abrasion of unspecified front wall of thorax, initial encounter: Secondary | ICD-10-CM | POA: Diagnosis not present

## 2019-05-03 DIAGNOSIS — Z7984 Long term (current) use of oral hypoglycemic drugs: Secondary | ICD-10-CM | POA: Insufficient documentation

## 2019-05-03 DIAGNOSIS — R109 Unspecified abdominal pain: Secondary | ICD-10-CM | POA: Insufficient documentation

## 2019-05-03 DIAGNOSIS — M25512 Pain in left shoulder: Secondary | ICD-10-CM | POA: Diagnosis not present

## 2019-05-03 DIAGNOSIS — S29001A Unspecified injury of muscle and tendon of front wall of thorax, initial encounter: Secondary | ICD-10-CM | POA: Diagnosis present

## 2019-05-03 LAB — I-STAT CHEM 8, ED
BUN: 10 mg/dL (ref 6–20)
Calcium, Ion: 1.14 mmol/L — ABNORMAL LOW (ref 1.15–1.40)
Chloride: 101 mmol/L (ref 98–111)
Creatinine, Ser: 0.5 mg/dL (ref 0.44–1.00)
Glucose, Bld: 231 mg/dL — ABNORMAL HIGH (ref 70–99)
HCT: 45 % (ref 36.0–46.0)
Hemoglobin: 15.3 g/dL — ABNORMAL HIGH (ref 12.0–15.0)
Potassium: 5.2 mmol/L — ABNORMAL HIGH (ref 3.5–5.1)
Sodium: 136 mmol/L (ref 135–145)
TCO2: 28 mmol/L (ref 22–32)

## 2019-05-03 LAB — COMPREHENSIVE METABOLIC PANEL
ALT: 23 U/L (ref 0–44)
AST: 22 U/L (ref 15–41)
Albumin: 4.2 g/dL (ref 3.5–5.0)
Alkaline Phosphatase: 95 U/L (ref 38–126)
Anion gap: 12 (ref 5–15)
BUN: 7 mg/dL (ref 6–20)
CO2: 24 mmol/L (ref 22–32)
Calcium: 9.8 mg/dL (ref 8.9–10.3)
Chloride: 101 mmol/L (ref 98–111)
Creatinine, Ser: 0.67 mg/dL (ref 0.44–1.00)
GFR calc Af Amer: >60 mL/min (ref >60)
GFR calc non Af Amer: >60 mL/min (ref >60)
Glucose, Bld: 220 mg/dL — ABNORMAL HIGH (ref 70–99)
Potassium: 3.7 mmol/L (ref 3.5–5.1)
Sodium: 137 mmol/L (ref 135–145)
Total Bilirubin: 0.6 mg/dL (ref 0.3–1.2)
Total Protein: 7.6 g/dL (ref 6.5–8.1)

## 2019-05-03 LAB — CBC WITH DIFFERENTIAL/PLATELET
Abs Immature Granulocytes: 0.1 10*3/uL — ABNORMAL HIGH (ref 0.00–0.07)
Basophils Absolute: 0 10*3/uL (ref 0.0–0.1)
Basophils Relative: 0 %
Eosinophils Absolute: 0.2 10*3/uL (ref 0.0–0.5)
Eosinophils Relative: 1 %
HCT: 43.2 % (ref 36.0–46.0)
Hemoglobin: 14.6 g/dL (ref 12.0–15.0)
Immature Granulocytes: 1 %
Lymphocytes Relative: 17 %
Lymphs Abs: 2.5 10*3/uL (ref 0.7–4.0)
MCH: 28.7 pg (ref 26.0–34.0)
MCHC: 33.8 g/dL (ref 30.0–36.0)
MCV: 85 fL (ref 80.0–100.0)
Monocytes Absolute: 0.7 10*3/uL (ref 0.1–1.0)
Monocytes Relative: 5 %
Neutro Abs: 11.4 10*3/uL — ABNORMAL HIGH (ref 1.7–7.7)
Neutrophils Relative %: 76 %
Platelets: 416 10*3/uL — ABNORMAL HIGH (ref 150–400)
RBC: 5.08 MIL/uL (ref 3.87–5.11)
RDW: 13 % (ref 11.5–15.5)
WBC: 14.8 10*3/uL — ABNORMAL HIGH (ref 4.0–10.5)
nRBC: 0 % (ref 0.0–0.2)

## 2019-05-03 LAB — URINALYSIS, ROUTINE W REFLEX MICROSCOPIC
Bilirubin Urine: NEGATIVE
Glucose, UA: NEGATIVE mg/dL
Hgb urine dipstick: NEGATIVE
Ketones, ur: NEGATIVE mg/dL
Leukocytes,Ua: NEGATIVE
Nitrite: NEGATIVE
Protein, ur: NEGATIVE mg/dL
Specific Gravity, Urine: >1.046 — ABNORMAL HIGH (ref 1.005–1.030)
pH: 5 (ref 5.0–8.0)

## 2019-05-03 LAB — LIPASE, BLOOD: Lipase: 24 U/L (ref 11–51)

## 2019-05-03 LAB — I-STAT BETA HCG BLOOD, ED (MC, WL, AP ONLY): I-stat hCG, quantitative: <5 m[IU]/mL (ref <5)

## 2019-05-03 LAB — ETHANOL: Alcohol, Ethyl (B): <10 mg/dL (ref <10)

## 2019-05-03 LAB — ABO/RH: ABO/RH(D): A NEG

## 2019-05-03 MED ORDER — SODIUM CHLORIDE 0.9 % IV BOLUS
1000.0000 mL | Freq: Once | INTRAVENOUS | Status: AC
  Administered 2019-05-03: 1000 mL via INTRAVENOUS

## 2019-05-03 MED ORDER — FENTANYL CITRATE (PF) 100 MCG/2ML IJ SOLN
25.0000 ug | Freq: Once | INTRAMUSCULAR | Status: AC
  Administered 2019-05-03: 25 ug via INTRAVENOUS
  Filled 2019-05-03: qty 2

## 2019-05-03 MED ORDER — NAPROXEN 500 MG PO TABS: 500.0000 mg | ORAL_TABLET | Freq: Two times a day (BID) | ORAL | 0 refills | Status: AC

## 2019-05-03 MED ORDER — METHOCARBAMOL 500 MG PO TABS: 500.0000 mg | ORAL_TABLET | Freq: Two times a day (BID) | ORAL | 0 refills | Status: AC

## 2019-05-03 MED ORDER — ONDANSETRON HCL 4 MG/2ML IJ SOLN
4.0000 mg | Freq: Once | INTRAMUSCULAR | Status: AC
  Administered 2019-05-03: 4 mg via INTRAVENOUS
  Filled 2019-05-03: qty 2

## 2019-05-03 MED ORDER — MORPHINE SULFATE (PF) 4 MG/ML IV SOLN
4.0000 mg | Freq: Once | INTRAVENOUS | Status: AC
  Administered 2019-05-03: 16:00:00 4 mg via INTRAVENOUS
  Filled 2019-05-03: qty 1

## 2019-05-03 MED ORDER — IOHEXOL 300 MG/ML  SOLN
100.0000 mL | Freq: Once | INTRAMUSCULAR | Status: AC | PRN
  Administered 2019-05-03: 15:00:00 100 mL via INTRAVENOUS
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
## 2019-05-03 NOTE — Discharge Instructions (Signed)
Your CT shows a right chest wall bruising.I have prescribed muscle relaxers for your pain, please do not drink or drive while taking.Please follow-up with PCP in 1 week for reevaluation of your symptoms.  You experience any bowel or bladder incontinence, fever, worsening in your symptoms please return to the ED.

## 2019-05-03 NOTE — ED Notes (Signed)
Patient back from CT.

## 2019-05-03 NOTE — ED Notes (Signed)
Fiance Ronalee Belts) would like any update. [phone]
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
## 2019-05-03 NOTE — ED Notes (Signed)
Portable Xray at bedside.

## 2021-11-27 DEATH — deceased
